# Patient Record
Sex: Male | Born: 1965 | Race: Black or African American | Hispanic: No | Marital: Married | Smoking: Never smoker
Health system: Southern US, Community
[De-identification: ages and names within clinical notes are randomized; demographics above are authoritative.]

## PROBLEM LIST (undated history)

## (undated) DIAGNOSIS — I471 Supraventricular tachycardia, unspecified: Secondary | ICD-10-CM

## (undated) DIAGNOSIS — T7840XA Allergy, unspecified, initial encounter: Secondary | ICD-10-CM

## (undated) HISTORY — DX: Allergy, unspecified, initial encounter: T78.40XA

---

## 2003-06-26 ENCOUNTER — Emergency Department (HOSPITAL_COMMUNITY): Admission: AD | Admit: 2003-06-26 | Discharge: 2003-06-26 | Payer: Self-pay | Admitting: Family Medicine

## 2007-05-13 ENCOUNTER — Emergency Department (HOSPITAL_COMMUNITY): Admission: EM | Admit: 2007-05-13 | Discharge: 2007-05-13 | Payer: Self-pay | Admitting: Emergency Medicine

## 2007-05-24 ENCOUNTER — Ambulatory Visit: Payer: Self-pay | Admitting: Internal Medicine

## 2008-05-16 ENCOUNTER — Encounter: Admission: RE | Admit: 2008-05-16 | Discharge: 2008-05-16 | Payer: Self-pay | Admitting: Chiropractic Medicine

## 2010-09-24 NOTE — Assessment & Plan Note (Signed)
Bruce Guerra                         ELECTROPHYSIOLOGY OFFICE NOTE   Bruce Guerra, VONDERHAAR                       MRN:          [id number]  DATE:05/24/2007                            DOB:          1965-12-20    REASON FOR VISIT:  Bruce Guerra is seen at the request of the emergency  room following a presentation with rapid supraventricular tachycardia  terminated, as best as I can tell, by adenosine.   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  This episode occurred abruptly after playing Wii tennis.  It was  accompanied by presyncope, diaphoresis, some chest pain and shortness of  breath.  It lasted about 15 minutes which included the call to EMS,  their arrival, an IV placed and probably adenosine being pushed.  He has  had no recurrent episodes.   He has no history of chest pain, shortness of breath or exercise  intolerance apart from the above-mentioned episode.  He has been noted  to be somewhat hypertensive.  His cardiac risk factors are not known  apart from hypertension and obesity.  There has been no cardiac  evaluation in the past.   PAST SURGICAL HISTORY:  Negative.   PAST MEDICAL HISTORY:  Allergies and hay fever, but otherwise is broadly  negative.   MEDICATIONS:  Metoprolol succinate started in the emergency room.   ALLERGIES:  No known drug allergies.   SOCIAL HISTORY:  He is married.  He has three children including a  daughter 26 and twin boys that are 54.  He does not use cigarettes,  alcohol or recreational drugs.   PHYSICAL EXAMINATION:  VITAL SIGNS:  Blood pressure was 146/90 with a  pulse of 67.  His weight was 242 pounds, height 5 feet 11 inches.  HEENT:  No icterus or xanthoma.  NECK:  The neck veins are flat.  The carotids are brisk and full  bilaterally without bruits.  BACK:  Without kyphosis or scoliosis.  LUNGS:  Clear.  HEART:  Heart sounds were regular without murmurs or gallops.  ABDOMEN:  Soft with active bowel sounds and without midline pulsation or  organomegaly.  Femoral pulses were 2+, distal pulses were intact.  EXTREMITIES:  There is no clubbing, cyanosis or edema.  NEUROLOGIC:  Grossly normal.  SKIN:  Warm and dry.   Electrocardiogram from Bruce Guerra demonstrated sinus rhythm at  99 with intervals of 0.15/0.19/0.31.  The axis was 60 degrees.  There  was a rapid transition in RR prime in lead V1.  There is no evidence of  ventricular pre-excitation.   Other laboratories were normal except for myoglobin of 462.   IMPRESSION/PLAN:  1. Recurrent supraventricular tachycardia.  2. Moderate symptoms of lightheadedness associated with #1.  3. Obesity.  4. Hypertension.   Bruce Guerra has supraventricular tachycardia probably with concealed  accessory pathway.  It has been relatively infrequent.  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  I have reviewed this with him and we have  discussed treatment options including observation, beta-blocker/calcium  blockers on as needed or daily basis similar to what strategy has been  pursued thus far, drugs with potential for poor arrhythmia and EP study  with RF catheter ablation.  We discussed the potential benefits as well  as the potential risks including, but not limited to death, perforation  and heart attack as well as heart block requiring pacemaker  implantation.  He understands these risks.  At this point, however,  given his financial situation, he would like not to pursue invasive  therapy.   We have decided to give him a prescription for verapamil 40 mg to take  on an as-needed basis as we do not know what the frequency of these  events will be.  Will plan to see him again in 6  months' time.   He is advised to call if he has any more problems.     Bruce Salvia, MD, Bruce Guerra  Electronically Signed    SCK/MedQ  DD: 05/24/2007  DT: 05/25/2007  Job #: [id number]

## 2011-01-30 LAB — COMPREHENSIVE METABOLIC PANEL
ALT: 41
AST: 36
Alkaline Phosphatase: 78
CO2: 28
Chloride: 103
GFR calc non Af Amer: >60
Glucose, Bld: 94
Potassium: 4.2
Sodium: 139

## 2011-01-30 LAB — DIFFERENTIAL
Basophils Absolute: 0
Lymphs Abs: 1.3
Monocytes Absolute: 0.3
Monocytes Relative: 4
Neutro Abs: 5.3

## 2011-01-30 LAB — POCT CARDIAC MARKERS
CKMB, poc: 2.7
Troponin i, poc: <0.05

## 2011-01-30 LAB — CBC
Platelets: 278
RBC: 4.69
WBC: 7.1

## 2012-08-11 ENCOUNTER — Ambulatory Visit: Payer: Self-pay | Admitting: Family Medicine

## 2012-08-11 VITALS — BP 132/95 | HR 78 | Temp 98.0°F | Resp 17 | Ht 70.5 in | Wt 234.0 lb

## 2012-08-11 DIAGNOSIS — R809 Proteinuria, unspecified: Secondary | ICD-10-CM | POA: Insufficient documentation

## 2012-08-11 DIAGNOSIS — Z0289 Encounter for other administrative examinations: Secondary | ICD-10-CM

## 2012-08-11 DIAGNOSIS — Z0000 Encounter for general adult medical examination without abnormal findings: Secondary | ICD-10-CM

## 2012-08-11 NOTE — Patient Instructions (Addendum)
Your blood pressure is mildly elevated today.  You qualify for a one year DOT card, but need to follow- up with the doctor of your choice to further evaluate this issue. Also, you had protein and blood in your urine.  This also needs to be checked by the doctor of your choice.

## 2012-08-11 NOTE — Progress Notes (Signed)
[facility] [address] [place] [address] [phone]  Date:  08/11/2012   Name:  Bruce Guerra   DOB:  05/08/1966   MRN:  [id number]  PCP:  No primary provider on file.    Chief Complaint: Employment Physical   History of Present Illness:  Bruce Guerra is a 47 y.o. very pleasant male patient who presents with the following:  Here today for a DOT exam.  He drives a semi- local route. No history of trouble with his BP.    He does not have a PCP or health insurance at this time, although he is in the process of applying for health coverage He does take methotrexate for dermatitis. Otherwise he is not aware of any significant medical problems  See long form  There is no problem list on file for this patient.   Past Medical History  Diagnosis Date  . Allergy     History reviewed. No pertinent past surgical history.  History  Substance Use Topics  . Smoking status: Never Smoker   . Smokeless tobacco: Not on file  . Alcohol Use: No    Family History  Problem Relation Age of Onset  . Diabetes Mother   . Hypertension Father     Allergies not on file  Medication list has been reviewed and updated.  No current outpatient prescriptions on file prior to visit.   No current facility-administered medications on file prior to visit.    Review of Systems:  As per HPI- otherwise negative.'  Physical Examination: Filed Vitals:   08/11/12 1031  BP: 158/96  Pulse: 78  Temp: 98 F (36.7 C)  Resp: 17   Filed Vitals:   08/11/12 1031  Height: 5' 10.5" (1.791 m)  Weight: 234 lb (106.142 kg)   Body mass index is 33.09 kg/(m^2). Ideal Body Weight: Weight in (lb) to have BMI = 25: 176.4  GEN: WDWN, NAD, Non-toxic, A & O x 3, overweight HEENT: Atraumatic, Normocephalic. Neck supple. No masses, No LAD.  Bilateral TM wnl, oropharynx normal.  PEERL,EOMI.   Ears and Nose: No external deformity. CV: RRR, No M/G/R. No JVD. No thrill. No extra  heart sounds. PULM: CTA B, no wheezes, crackles, rhonchi. No retractions. No resp. distress. No accessory muscle use. ABD: S, NT, ND, +BS. No rebound. No HSM. EXTR: No c/c/e NEURO Normal gait.  Normal strength and DTR all extremities  PSYCH: Normally interactive. Conversant. Not depressed or anxious appearing.  Calm demeanor.  GU: no inguinal hernia noted.  Mild rash on extremities which appears consistent with an eczema type rash Assessment and Plan: Physical exam, annual  DOT exam.  He has proteinuria/ hematuria and elevated BP.  Will give a one year card today.  Encouraged him to follow- up the proteinuria/ hematuria and BP with the doctor of his choice.   Signed Abbe Amsterdam, MD

## 2012-08-28 ENCOUNTER — Encounter: Payer: Self-pay | Admitting: Family Medicine

## 2013-09-05 ENCOUNTER — Ambulatory Visit: Payer: Self-pay

## 2015-03-09 DIAGNOSIS — M79673 Pain in unspecified foot: Secondary | ICD-10-CM

## 2015-03-15 ENCOUNTER — Emergency Department (HOSPITAL_COMMUNITY)
Admission: EM | Admit: 2015-03-15 | Discharge: 2015-03-15 | Disposition: A | Discharge status: Home / Self Care | Payer: Self-pay | Attending: Emergency Medicine | Admitting: Emergency Medicine

## 2015-03-15 ENCOUNTER — Emergency Department (HOSPITAL_COMMUNITY): Payer: Self-pay

## 2015-03-15 ENCOUNTER — Encounter (HOSPITAL_COMMUNITY): Payer: Self-pay | Admitting: General Practice

## 2015-03-15 DIAGNOSIS — Z79899 Other long term (current) drug therapy: Secondary | ICD-10-CM | POA: Insufficient documentation

## 2015-03-15 DIAGNOSIS — I471 Supraventricular tachycardia: Secondary | ICD-10-CM | POA: Insufficient documentation

## 2015-03-15 HISTORY — DX: Supraventricular tachycardia, unspecified: I47.10

## 2015-03-15 HISTORY — DX: Supraventricular tachycardia: I47.1

## 2015-03-15 LAB — CBC WITH DIFFERENTIAL/PLATELET
BASOS ABS: 0 10*3/uL (ref 0.0–0.1)
Basophils Relative: 0 %
Eosinophils Absolute: 0.1 10*3/uL (ref 0.0–0.7)
Eosinophils Relative: 1 %
HEMATOCRIT: 40.7 % (ref 39.0–52.0)
HEMOGLOBIN: 13.5 g/dL (ref 13.0–17.0)
LYMPHS PCT: 14 %
Lymphs Abs: 1 10*3/uL (ref 0.7–4.0)
MCH: 30.3 pg (ref 26.0–34.0)
MCHC: 33.2 g/dL (ref 30.0–36.0)
MCV: 91.3 fL (ref 78.0–100.0)
MONO ABS: 1.1 10*3/uL — AB (ref 0.1–1.0)
MONOS PCT: 15 %
NEUTROS ABS: 5.2 10*3/uL (ref 1.7–7.7)
NEUTROS PCT: 70 %
Platelets: 257 10*3/uL (ref 150–400)
RBC: 4.46 MIL/uL (ref 4.22–5.81)
RDW: 12.6 % (ref 11.5–15.5)
WBC: 7.5 10*3/uL (ref 4.0–10.5)

## 2015-03-15 LAB — BASIC METABOLIC PANEL
ANION GAP: 13 (ref 5–15)
BUN: 14 mg/dL (ref 6–20)
CO2: 25 mmol/L (ref 22–32)
Calcium: 9.8 mg/dL (ref 8.9–10.3)
Chloride: 102 mmol/L (ref 101–111)
Creatinine, Ser: 1.47 mg/dL — ABNORMAL HIGH (ref 0.61–1.24)
GFR calc Af Amer: >60 mL/min (ref >60)
GFR calc non Af Amer: 54 mL/min — ABNORMAL LOW (ref >60)
GLUCOSE: 76 mg/dL (ref 65–99)
POTASSIUM: 4.4 mmol/L (ref 3.5–5.1)
Sodium: 140 mmol/L (ref 135–145)

## 2015-03-15 LAB — TROPONIN I: TROPONIN I: 0.03 ng/mL (ref <0.031)

## 2015-03-15 MED ORDER — METOPROLOL SUCCINATE ER 25 MG PO TB24: 25.0000 mg | ORAL_TABLET | Freq: Every day | ORAL | Status: DC

## 2015-03-15 NOTE — Discharge Instructions (Signed)
[phone]].   They will call tomorrow for an appt.  New medication for your heart rate.  Your kidney test was slightly elevated.

## 2015-03-15 NOTE — ED Notes (Signed)
Pt presents via [facility] with complaints of heart palpitations. 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 When EMS arrived pts heart rate was 204. EMS administered 6mg , 12mg  and another 12 mg of adenosine before pt converted back to a NSR. Pt is currently asymptomatic, and HR is at 95. Pt is A/O. Pt reports 4 episodes of SVT, in his life. Pt denies CP, or N/V.

## 2015-03-16 NOTE — ED Provider Notes (Signed)
CSN: [id number]     Arrival date & time 03/15/15  1750 History   First MD Initiated Contact with Patient 03/15/15 1752     Chief Complaint  Patient presents with  . Tachycardia     (Consider location/radiation/quality/duration/timing/severity/associated sxs/prior Treatment) HPI.... Level V caveat for urgent need for intervention. Patient was doing heavy lifting when he felt a sense of a rapid heartbeat. EMS was notified. He appeared to be in a supraventricular tachycardia rate 200. Adenosine 6, 12, 12 mg administered which resulted in a conversion to normal sinus rhythm. No chest pain. Minimal dyspnea and diaphoresis initially. Patient reports similar symptoms 4-5 times the past.  Past Medical History  Diagnosis Date  . Allergy   . SVT (supraventricular tachycardia) (HCC)    History reviewed. No pertinent past surgical history. Family History  Problem Relation Age of Onset  . Diabetes Mother   . Hypertension Father    Social History  Substance Use Topics  . Smoking status: Never Smoker   . Smokeless tobacco: None  . Alcohol Use: No    Review of Systems  Reason unable to perform ROS: Urgent need for intervention.      Allergies  Review of patient's allergies indicates no known allergies.  Home Medications   Prior to Admission medications   Medication Sig Start Date End Date Taking? Authorizing Provider  cetirizine (ZYRTEC) 10 MG tablet Take 10 mg by mouth daily.   Yes Historical Provider, MD  fexofenadine (ALLEGRA) 180 MG tablet Take 180 mg by mouth daily.   Yes Historical Provider, MD  methotrexate (RHEUMATREX) 10 MG tablet Take 10 mg by mouth once a week. Caution: Chemotherapy. Protect from light.    Historical Provider, MD  metoprolol succinate (TOPROL-XL) 25 MG 24 hr tablet Take 1 tablet (25 mg total) by mouth daily. 03/15/15   Donnetta HutchingBrian Calloway Andrus, MD   BP 130/87 mmHg  Pulse 73  Temp(Src) 98.9 F (37.2 C) (Oral)  Resp 15  Ht 5\' 11"  (1.803 m)  Wt 235 lb (106.595 kg)   BMI 32.79 kg/m2  SpO2 95% Physical Exam  Constitutional: He is oriented to person, place, and time. He appears well-developed and well-nourished.  HENT:  Head: Normocephalic and atraumatic.  Eyes: Conjunctivae and EOM are normal. Pupils are equal, round, and reactive to light.  Neck: Normal range of motion. Neck supple.  Cardiovascular: Normal rate and regular rhythm.   Pulmonary/Chest: Effort normal and breath sounds normal.  Abdominal: Soft. Bowel sounds are normal.  Musculoskeletal: Normal range of motion.  Neurological: He is alert and oriented to person, place, and time.  Skin: Skin is warm and dry.  Psychiatric: He has a normal mood and affect. His behavior is normal.  Nursing note and vitals reviewed.   ED Course  Procedures (including critical care time) Labs Review Labs Reviewed  BASIC METABOLIC PANEL - Abnormal; Notable for the following:    Creatinine, Ser 1.47 (*)    GFR calc non Af Amer 54 (*)    All other components within normal limits  CBC WITH DIFFERENTIAL/PLATELET - Abnormal; Notable for the following:    Monocytes Absolute 1.1 (*)    All other components within normal limits  TROPONIN I    Imaging Review Dg Chest 2 View  03/15/2015  CLINICAL DATA:  49 year old with acute onset of palpitations earlier today with heart rate up to 204, converting after 3 rounds of Adenosine. Patient has had prior episodes of SVT. EXAM: CHEST  2 VIEW COMPARISON:  05/13/2007. FINDINGS:  Cardiac silhouette upper normal in size, unchanged. Hilar and mediastinal contours otherwise unremarkable. Lungs clear. Bronchovascular markings normal. Pulmonary vascularity normal. No visible pleural effusions. No pneumothorax. Visualized bony thorax intact. IMPRESSION: Stable borderline heart size.  No acute cardiopulmonary disease. Electronically Signed   By: Hulan Saas M.D.   On: 03/15/2015 18:35   I have personally reviewed and evaluated these images and lab results as part of my medical  decision-making.   EKG Interpretation   Date/Time:  Thursday March 15 2015 17:58:55 EDT Ventricular Rate:  97 PR Interval:  159 QRS Duration: 93 QT Interval:  362 QTC Calculation: 460 R Axis:   60 Text Interpretation:  Sinus rhythm RSR' in V1 or V2, right VCD or RVH  Nonspecific T abnormalities, anterior leads Confirmed by Tamryn Popko  MD, Vendetta Pittinger  [phone]) on 03/15/2015 6:31:54 PM      MDM   Final diagnoses:  SVT (supraventricular tachycardia) (HCC)    Discussed clinical scenario with cardiologist on-call. He recommended Toprol extended release 25 mg daily. Follow-up with [facility] cardiology. These findings were discussed with the patient and his family. I also discussed his minimally elevated creatinine.    Donnetta Hutching, MD 03/16/15 [phone]

## 2015-03-28 NOTE — Progress Notes (Signed)
Patient ID: Bruce Guerra, male   DOB: 1965/08/11, 49 y.o.   MRN: [id number]     Cardiology Office Note   Date:  03/28/2015   ID:  Bruce Guerra, DOB 1965-09-26, MRN [id number]  PCP:  No primary care provider on file.  Cardiologist:   Charlton Haws, MD   No chief complaint on file.     History of Present Illness: Bruce Guerra is a 49 y.o. male who presents for evaluation of PSVT.  Seen in ER 03/16/15  HR reported 200 No ECG documentation Patient indicated has happened 4-5 times before.  Given adenosine 6,12,12 with conversion to NSR  NO accessory pathway seen on baseline ECG  Labs ok except Cr 1.4  No TSH done Troponin negatie     Past Medical History  Diagnosis Date  . Allergy   . SVT (supraventricular tachycardia) (HCC)     No past surgical history on file.   Current Outpatient Prescriptions  Medication Sig Dispense Refill  . cetirizine (ZYRTEC) 10 MG tablet Take 10 mg by mouth daily.    . fexofenadine (ALLEGRA) 180 MG tablet Take 180 mg by mouth daily.    . methotrexate (RHEUMATREX) 10 MG tablet Take 10 mg by mouth once a week. Caution: Chemotherapy. Protect from light.    . metoprolol succinate (TOPROL-XL) 25 MG 24 hr tablet Take 1 tablet (25 mg total) by mouth daily. 30 tablet 1   No current facility-administered medications for this visit.    Allergies:   Review of patient's allergies indicates no known allergies.    Social History:  The patient  reports that he has never smoked. He does not have any smokeless tobacco history on file. He reports that he does not drink alcohol or use illicit drugs.   Family History:  The patient's family history includes Diabetes in his mother; Hypertension in his father.    ROS:  Please see the history of present illness.   Otherwise, review of systems are positive for none.   All other systems are reviewed and negative.    PHYSICAL EXAM: VS:  There were no vitals taken for this visit. , BMI There is no weight on file to  calculate BMI. Affect appropriate Healthy:  appears stated age HEENT: normal Neck supple with no adenopathy JVP normal no bruits no thyromegaly Lungs clear with no wheezing and good diaphragmatic motion Heart:  S1/S2 no murmur, no rub, gallop or click PMI normal Abdomen: benighn, BS positve, no tenderness, no AAA no bruit.  No HSM or HJR Distal pulses intact with no bruits No edema Neuro non-focal Skin warm and dry No muscular weakness    EKG:  Post conversion NSR no pre excitation nonspecific ST changes    Recent Labs: 03/15/2015: BUN 14; Creatinine, Ser 1.47*; Hemoglobin 13.5; Platelets 257; Potassium 4.4; Sodium 140    Lipid Panel No results found for: CHOL, TRIG, HDL, CHOLHDL, VLDL, LDLCALC, LDLDIRECT    Wt Readings from Last 3 Encounters:  03/15/15 106.595 kg (235 lb)  08/11/12 106.142 kg (234 lb)      Other studies Reviewed: Additional studies/ records that were reviewed today include: ER notes and ECG;s LABS.    ASSESSMENT AND PLAN:  1.  PSVT:  Refer to EP for consideration of ablation continue low dose beta blocker  Echo to r/o structural heart disease   Current medicines are reviewed at length with the patient today.  The patient does not have concerns regarding medicines.  The following changes  have been made:  no change  Labs/ tests ordered today include: Echo  Refer to EP  No orders of the defined types were placed in this encounter.     Disposition:   FU with EP next available      Signed, Charlton HawsPeter Renelle Stegenga, MD  03/28/2015 3:16 PM    [facility] [address], [place], [place]  [address] Phone: [phone]; Fax: [phone]

## 2015-03-29 ENCOUNTER — Encounter: Payer: Self-pay | Admitting: Cardiovascular Disease

## 2015-03-29 DIAGNOSIS — R0989 Other specified symptoms and signs involving the circulatory and respiratory systems: Secondary | ICD-10-CM

## 2015-03-30 ENCOUNTER — Encounter: Payer: Self-pay | Admitting: Cardiovascular Disease

## 2015-09-24 ENCOUNTER — Ambulatory Visit (INDEPENDENT_AMBULATORY_CARE_PROVIDER_SITE_OTHER): Payer: Self-pay | Admitting: Family Medicine

## 2015-09-24 VITALS — BP 130/88 | HR 80 | Temp 97.9°F | Resp 16 | Ht 71.0 in | Wt 236.0 lb

## 2015-09-24 DIAGNOSIS — Z024 Encounter for examination for driving license: Secondary | ICD-10-CM

## 2015-09-24 DIAGNOSIS — Z021 Encounter for pre-employment examination: Secondary | ICD-10-CM

## 2015-09-24 NOTE — Patient Instructions (Addendum)
It is important that you see the cardiologist to evaluate you for your supraventricular tachycardia history. Please do so in the near future.  Your blood pressure was transiently elevated. The cardiologist can recheck it also, but I would recommend that you get it checked every couple of months to make sure that it is not staying elevated often.  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    IF you received an x-ray today, you will receive an invoice from [facility]. Please contact [facility] at [phone] with questions or concerns regarding your invoice.   IF you received labwork today, you will receive an invoice from [facility]. Please contact [facility] at [phone] with questions or concerns regarding your invoice.   Our billing staff will not be able to assist you with questions regarding bills from these companies.  You will be contacted with the lab results as soon as they are available. The fastest way to get your results is to activate your My Chart account. Instructions are located on the last page of this paperwork. If you have not heard from us regarding the results in 2 weeks, please contact this office.

## 2015-09-24 NOTE — Progress Notes (Signed)
Patient ID: Bruce Guerra, male    DOB: 1965/12/26  Age: 50 y.o. MRN: [id number]  Chief Complaint  Patient presents with  . 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 Here for his DOT exam. He feels well with no problems reported. In talking with him and reviewing the old records I see that he had to go to the emergency room last year for an episode of paroxysmal SVT which responded to adenosine. Apparently he has had 4 such episodes since he was 50 years old. He was told to see a cardiologist, but insurance lapsed and he is not yet. He plans to see one soon. The symptoms come on with just a palpitation sensation in his chest, with ample time to pull off the road. That is when he did last time, and called 911. He is not on any medication at this time.  Current allergies, medications, problem list, past/family and social histories reviewed.  Objective:  BP 130/88 mmHg  Pulse 80  Temp(Src) 97.9 F (36.6 C) (Oral)  Resp 16  Ht 5\' 11"  (1.803 m)  Wt 236 lb (107.049 kg)  BMI 32.93 kg/m2  SpO2 99%  Healthy-appearing man in no acute distress. TMs well. Throat clear. Neck supple without nodes. No carotid bruits. Fundi benign. Chest clear. Heart regular without murmur. Himself without mass or tenderness. Normal male external genitalia with testes descended. No hernias. Extremities unremarkable. Has a chronic appearing dermatosis scattered on his trunk and legs, may be a tinea.  Assessment & Plan:   Assessment: 1. Driver's permit physical examination       Plan: Normal DOT examination but history of paroxysmal SVT rarely. Although I did not restrict his card I did tell him it is important he sees a cardiologist. Also advised him to keep an eye on his blood pressure. It was fine when I took it.  No orders of the defined types were placed in this encounter.    No orders of the defined types were placed in this encounter.          Patient Instructions   It is important that you see the cardiologist to evaluate you for your supraventricular tachycardia history. Please do so in the near future.  Your blood pressure was transiently elevated. The cardiologist can recheck it also, but I would recommend that you get it checked every couple of months to make sure that it is not staying elevated often.  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    IF you received an x-ray today, you will receive an invoice from [facility]. Please contact [facility] at [phone] with questions or concerns regarding your invoice.   IF you received labwork today, you will receive an invoice from [facility]. Please contact [facility] at [phone] with questions or concerns regarding your invoice.   Our billing staff will not be able to assist you with questions regarding bills from these companies.  You will be contacted with the lab results as soon as they are available. The fastest way to get your results is to activate your My Chart account. Instructions are located on the last page of this paperwork. If you have not heard from us regarding the results in 2 weeks, please contact this office.           No Follow-up on file.   HOPPER,DAVID, MD 09/24/2015

## 2015-09-25 ENCOUNTER — Emergency Department (HOSPITAL_BASED_OUTPATIENT_CLINIC_OR_DEPARTMENT_OTHER)
Admission: EM | Admit: 2015-09-25 | Discharge: 2015-09-25 | Disposition: A | Discharge status: Home / Self Care | Payer: BLUE CROSS/BLUE SHIELD | Attending: Emergency Medicine | Admitting: Emergency Medicine

## 2015-09-25 ENCOUNTER — Encounter (HOSPITAL_BASED_OUTPATIENT_CLINIC_OR_DEPARTMENT_OTHER): Payer: Self-pay

## 2015-09-25 ENCOUNTER — Emergency Department (HOSPITAL_BASED_OUTPATIENT_CLINIC_OR_DEPARTMENT_OTHER): Payer: BLUE CROSS/BLUE SHIELD

## 2015-09-25 DIAGNOSIS — IMO0002 Reserved for concepts with insufficient information to code with codable children: Secondary | ICD-10-CM

## 2015-09-25 DIAGNOSIS — R42 Dizziness and giddiness: Secondary | ICD-10-CM | POA: Insufficient documentation

## 2015-09-25 LAB — CBC WITH DIFFERENTIAL/PLATELET
BASOS ABS: 0 10*3/uL (ref 0.0–0.1)
Basophils Relative: 1 %
Eosinophils Absolute: 0.2 10*3/uL (ref 0.0–0.7)
Eosinophils Relative: 2 %
HEMATOCRIT: 41.5 % (ref 39.0–52.0)
HEMOGLOBIN: 14.1 g/dL (ref 13.0–17.0)
LYMPHS ABS: 1.4 10*3/uL (ref 0.7–4.0)
LYMPHS PCT: 22 %
MCH: 30.6 pg (ref 26.0–34.0)
MCHC: 34 g/dL (ref 30.0–36.0)
MCV: 90 fL (ref 78.0–100.0)
Monocytes Absolute: 0.9 10*3/uL (ref 0.1–1.0)
Monocytes Relative: 14 %
NEUTROS ABS: 4 10*3/uL (ref 1.7–7.7)
NEUTROS PCT: 61 %
PLATELETS: 235 10*3/uL (ref 150–400)
RBC: 4.61 MIL/uL (ref 4.22–5.81)
RDW: 12.3 % (ref 11.5–15.5)
WBC: 6.4 10*3/uL (ref 4.0–10.5)

## 2015-09-25 LAB — BASIC METABOLIC PANEL
ANION GAP: 6 (ref 5–15)
BUN: 15 mg/dL (ref 6–20)
CHLORIDE: 102 mmol/L (ref 101–111)
CO2: 29 mmol/L (ref 22–32)
Calcium: 9.5 mg/dL (ref 8.9–10.3)
Creatinine, Ser: 1.03 mg/dL (ref 0.61–1.24)
GFR calc Af Amer: >60 mL/min (ref >60)
GLUCOSE: 100 mg/dL — AB (ref 65–99)
POTASSIUM: 4.1 mmol/L (ref 3.5–5.1)
Sodium: 137 mmol/L (ref 135–145)

## 2015-09-25 LAB — TROPONIN I: Troponin I: <0.03 ng/mL (ref <0.031)

## 2015-09-25 MED ORDER — MECLIZINE HCL 12.5 MG PO TABS: 12.5000 mg | ORAL_TABLET | Freq: Three times a day (TID) | ORAL | Status: DC | PRN

## 2015-09-25 MED ORDER — GI COCKTAIL ~~LOC~~
30.0000 mL | Freq: Once | ORAL | Status: AC
  Administered 2015-09-25: 30 mL via ORAL
  Filled 2015-09-25: qty 30

## 2015-09-25 MED ORDER — FLUTICASONE PROPIONATE 50 MCG/ACT NA SUSP: 2.0000 | Freq: Every day | NASAL | Status: DC

## 2015-09-25 NOTE — ED Provider Notes (Signed)
CSN: [id number]     Arrival date & time 09/25/15  0335 History   First MD Initiated Contact with Patient 09/25/15 [phone]     Chief Complaint  Patient presents with  . Dizziness     (Consider location/radiation/quality/duration/timing/severity/associated sxs/prior Treatment) Patient is a 50 y.o. male presenting with dizziness. The history is provided by the patient.  Dizziness Quality:  Room spinning Severity:  Severe Onset quality:  Sudden Timing:  Constant Progression:  Resolved Chronicity:  Recurrent Context: ear pain   Context: not with bowel movement and not with loss of consciousness   Relieved by:  Nothing Worsened by:  Nothing Ineffective treatments:  None tried Associated symptoms: no blood in stool, no chest pain, no headaches, no hearing loss, no palpitations, no shortness of breath, no syncope, no tinnitus, no vision changes and no weakness   Risk factors: no Meniere's disease   Worse laying with that ear down.  No focal neuro deficits.    Past Medical History  Diagnosis Date  . Allergy   . SVT (supraventricular tachycardia) (HCC)    History reviewed. No pertinent past surgical history. Family History  Problem Relation Age of Onset  . Diabetes Mother   . Hypertension Father    Social History  Substance Use Topics  . Smoking status: Never Smoker   . Smokeless tobacco: None  . Alcohol Use: No    Review of Systems  HENT: Negative for hearing loss and tinnitus.   Respiratory: Negative for shortness of breath.   Cardiovascular: Negative for chest pain, palpitations and syncope.  Gastrointestinal: Negative for blood in stool.  Neurological: Positive for dizziness. Negative for weakness and headaches.  All other systems reviewed and are negative.     Allergies  Review of patient's allergies indicates no known allergies.  Home Medications   Prior to Admission medications   Medication Sig Start Date End Date Taking? Authorizing Provider  cetirizine  (ZYRTEC) 10 MG tablet Take 10 mg by mouth daily.    Historical Provider, MD  fexofenadine (ALLEGRA) 180 MG tablet Take 180 mg by mouth daily.    Historical Provider, MD  methotrexate (RHEUMATREX) 10 MG tablet Take 10 mg by mouth once a week. Reported on 09/24/2015    Historical Provider, MD  metoprolol succinate (TOPROL-XL) 25 MG 24 hr tablet Take 1 tablet (25 mg total) by mouth daily. Patient not taking: Reported on 09/24/2015 03/15/15   Donnetta Hutching, MD   BP 141/94 mmHg  Pulse 66  Temp(Src) 97.8 F (36.6 C) (Oral)  Resp 18  Ht 5\' 11"  (1.803 m)  Wt 235 lb (106.595 kg)  BMI 32.79 kg/m2  SpO2 99% Physical Exam  Constitutional: He is oriented to person, place, and time. He appears well-developed and well-nourished. No distress.  HENT:  Head: Normocephalic and atraumatic.  Right Ear: No mastoid tenderness. Tympanic membrane is retracted. Tympanic membrane is not injected, not perforated and not erythematous. No hemotympanum.  Left Ear: No mastoid tenderness. Tympanic membrane is not injected, not perforated, not erythematous and not retracted. No hemotympanum.  Mouth/Throat: Oropharynx is clear and moist.  Right TM is retracted  Eyes: Conjunctivae and EOM are normal. Pupils are equal, round, and reactive to light.  Neck: Normal range of motion. Neck supple.  Cardiovascular: Normal rate, regular rhythm and intact distal pulses.   Pulmonary/Chest: Effort normal and breath sounds normal. No stridor. He has no wheezes. He has no rales.  Abdominal: Soft. Bowel sounds are normal. There is no tenderness. There is no  rebound and no guarding.  Musculoskeletal: Normal range of motion.  Neurological: He is alert and oriented to person, place, and time. He has normal reflexes. No cranial nerve deficit.  Skin: Skin is warm and dry.  Psychiatric: He has a normal mood and affect.    ED Course  Procedures (including critical care time) Labs Review Labs Reviewed  CBC WITH DIFFERENTIAL/PLATELET  BASIC  METABOLIC PANEL  TROPONIN I    Imaging Review No results found. I have personally reviewed and evaluated these images and lab results as part of my medical decision-making.   EKG Interpretation   Date/Time:  Tuesday Sep 25 2015 04:00:56 EDT Ventricular Rate:  65 PR Interval:  168 QRS Duration: 107 QT Interval:  411 QTC Calculation: 427 R Axis:   73 Text Interpretation:  Sinus rhythm RSR' in V1 or V2,   Confirmed by  [facility]  MD, Jazzlyn Huizenga ([id number]) on 09/25/2015 4:08:09 AM      MDM   Final diagnoses:  None    Filed Vitals:   09/25/15 0346  BP: 141/94  Pulse: 66  Temp: 97.8 F (36.6 C)  Resp: 18    Results for orders placed or performed during the hospital encounter of 09/25/15  CBC with Differential/Platelet  Result Value Ref Range   WBC 6.4 4.0 - 10.5 K/uL   RBC 4.61 4.22 - 5.81 MIL/uL   Hemoglobin 14.1 13.0 - 17.0 g/dL   HCT 91.441.5 78.239.0 - 95.652.0 %   MCV 90.0 78.0 - 100.0 fL   MCH 30.6 26.0 - 34.0 pg   MCHC 34.0 30.0 - 36.0 g/dL   RDW 21.312.3 08.611.5 - 57.815.5 %   Platelets 235 150 - 400 K/uL   Neutrophils Relative % 61 %   Neutro Abs 4.0 1.7 - 7.7 K/uL   Lymphocytes Relative 22 %   Lymphs Abs 1.4 0.7 - 4.0 K/uL   Monocytes Relative 14 %   Monocytes Absolute 0.9 0.1 - 1.0 K/uL   Eosinophils Relative 2 %   Eosinophils Absolute 0.2 0.0 - 0.7 K/uL   Basophils Relative 1 %   Basophils Absolute 0.0 0.0 - 0.1 K/uL  Basic metabolic panel  Result Value Ref Range   Sodium 137 135 - 145 mmol/L   Potassium 4.1 3.5 - 5.1 mmol/L   Chloride 102 101 - 111 mmol/L   CO2 29 22 - 32 mmol/L   Glucose, Bld 100 (H) 65 - 99 mg/dL   BUN 15 6 - 20 mg/dL   Creatinine, Ser 4.691.03 0.61 - 1.24 mg/dL   Calcium 9.5 8.9 - 62.910.3 mg/dL   GFR calc non Af Amer >60 >60 mL/min   GFR calc Af Amer >60 >60 mL/min   Anion gap 6 5 - 15  Troponin I  Result Value Ref Range   Troponin I <0.03 <0.031 ng/mL   Dg Chest 2 View  09/25/2015  CLINICAL DATA:  Pt c/o dizziness after eating vanilla pudding  last pm, started around 2130 but has got better since he came here EXAM: CHEST  2 VIEW COMPARISON:  03/15/2015 FINDINGS: The heart size and mediastinal contours are within normal limits. Both lungs are clear. The visualized skeletal structures are unremarkable. IMPRESSION: No active cardiopulmonary disease. Electronically Signed   By: Burman NievesWilliam  Stevens M.D.   On: 09/25/2015 05:00   Ct Head Wo Contrast  09/25/2015  CLINICAL DATA:  Dizziness after eating vanilla pudding last night, now improving. EXAM: CT HEAD WITHOUT CONTRAST TECHNIQUE: Contiguous axial images were obtained from the  base of the skull through the vertex without intravenous contrast. COMPARISON:  None. FINDINGS: INTRACRANIAL CONTENTS: The ventricles and sulci are normal. No intraparenchymal hemorrhage, mass effect nor midline shift. No acute large vascular territory infarcts. No abnormal extra-axial fluid collections. Basal cisterns are patent. ORBITS: The included ocular globes and orbital contents are normal. SINUSES: The mastoid aircells and included paranasal sinuses are well-aerated. SKULL/SOFT TISSUES: No skull fracture. No significant soft tissue swelling. IMPRESSION: Normal CT HEAD. Electronically Signed   By: Awilda Metro M.D.   On: 09/25/2015 04:58    Symptoms consistent with BPV.  Will prescribe meclizine as well as flonase to decongest.  Follow up with your family doctor for recheck.  Strict return precautions given   Othelia Riederer, MD 09/25/15 [phone]

## 2015-09-25 NOTE — Discharge Instructions (Signed)

## 2015-09-25 NOTE — ED Notes (Signed)
Pt c/o dizziness after eating vanilla pudding last pm, started around 2130 but has got better since he came here;

## 2015-10-23 ENCOUNTER — Ambulatory Visit (INDEPENDENT_AMBULATORY_CARE_PROVIDER_SITE_OTHER): Payer: BLUE CROSS/BLUE SHIELD | Admitting: Cardiovascular Disease

## 2015-10-23 ENCOUNTER — Encounter: Payer: Self-pay | Admitting: Cardiovascular Disease

## 2015-10-23 VITALS — BP 134/82 | HR 73 | Ht 71.0 in | Wt 236.0 lb

## 2015-10-23 DIAGNOSIS — R0600 Dyspnea, unspecified: Secondary | ICD-10-CM

## 2015-10-23 DIAGNOSIS — I471 Supraventricular tachycardia: Secondary | ICD-10-CM | POA: Diagnosis not present

## 2015-10-23 MED ORDER — METOPROLOL SUCCINATE ER 50 MG PO TB24: 50.0000 mg | ORAL_TABLET | ORAL | Status: DC | PRN

## 2015-10-23 NOTE — Progress Notes (Signed)
Patient ID: Bruce Guerra, male   DOB: 01-02-1966, 50 y.o.   MRN: [id number]     Cardiology Office Note   Date:  10/23/2015   ID:  Bruce AlarGeorge S Alberts, DOB 01-02-1966, MRN [id number]  PCP:  No PCP Per Patient  Cardiologist:   Charlton HawsPeter Nishan, MD   No chief complaint on file.     History of Present Illness: Bruce AlarGeorge S Kain is a 50 y.o. male who presents for evaluation of SVT  Seen in ER 03/2015 for tachycardia. Noted to be in SVT rate 200.  Converted with adenosine Indicated at that time he had had 4-5 previous episodes. Seen in ER 5/16 for dizziness rhythm stable thought to be vertigo.  Review of ECG;s shows no pre excitation And normal QT.  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    Requires DOT physical for license   Has a skin condition like psoriasis that is improved with methotrexate     Past Medical History  Diagnosis Date  . Allergy   . SVT (supraventricular tachycardia) (HCC)     No past surgical history on file.   Current Outpatient Prescriptions  Medication Sig Dispense Refill  . cetirizine (ZYRTEC) 10 MG tablet Take 10 mg by mouth daily.    . fexofenadine (ALLEGRA) 180 MG tablet Take 180 mg by mouth daily.    . fluticasone (FLONASE) 50 MCG/ACT nasal spray Place 2 sprays into both nostrils daily. 16 g 0  . meclizine (ANTIVERT) 12.5 MG tablet Take 1 tablet (12.5 mg total) by mouth 3 (three) times daily as needed for dizziness. 12 tablet 0  . methotrexate (RHEUMATREX) 10 MG tablet Take 10 mg by mouth once a week. Reported on 09/24/2015     No current facility-administered medications for this visit.    Allergies:   Review of patient's allergies indicates no known allergies.    Social History:  The patient  reports that he has never smoked. He  does not have any smokeless tobacco history on file. He reports that he does not drink alcohol or use illicit drugs.   Family History:  The patient's family history includes Diabetes in his mother; Hypertension in his father.    ROS:  Please see the history of present illness.   Otherwise, review of systems are positive for none.   All other systems are reviewed and negative.    PHYSICAL EXAM: VS:  BP 134/82 mmHg  Pulse 73  Ht 5\' 11"  (1.803 m)  Wt 107.049 kg (236 lb)  BMI 32.93 kg/m2  SpO2 98% , BMI Body mass index is 32.93 kg/(m^2). Affect appropriate Healthy:  appears stated age HEENT: normal Neck supple with no adenopathy JVP normal no bruits no thyromegaly Lungs clear with no wheezing and good diaphragmatic motion Heart:  S1/S2 no murmur, no rub, gallop or click PMI normal Abdomen: benighn, BS positve, no tenderness, no AAA no bruit.  No HSM or HJR Distal pulses intact with no bruits No edema Neuro non-focal Skin warm and dry No muscular weakness    EKG:   03/15/15  SR RSR' rate 97  QT 460  09/25/15  SR rate 65 RSR' QT 427   Recent Labs: 09/25/2015: BUN 15; Creatinine, Ser  1.03; Hemoglobin 14.1; Platelets 235; Potassium 4.1; Sodium 137    Lipid Panel No results found for: CHOL, TRIG, HDL, CHOLHDL, VLDL, LDLCALC, LDLDIRECT    Wt Readings from Last 3 Encounters:  10/23/15 107.049 kg (236 lb)  09/25/15 106.595 kg (235 lb)  09/24/15 107.049 kg (236 lb)      Other studies Reviewed: Additional studies/ records that were reviewed today include: ER records, labs ECG CT.    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 Derm: continue methotrexate labs ok in ER 3. Vertigo resolved PRN antivert discussed vestibular PT if recurs 4. Allergies:  Avoid zyrtec given history of tachycardia ok to use allegra  5. 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  The patient does not have concerns regarding medicines.  The following changes have been made:  no change  Labs/ tests ordered today include: None   No orders of the defined types were placed in this encounter.     Disposition:   FU with me in a year if ETT and Echo ok      Signed, Charlton Haws, MD  10/23/2015 3:29 PM    [facility] [address] [place], [place], [place]  [address] Phone: [phone]; Fax: [phone]

## 2015-10-23 NOTE — Patient Instructions (Addendum)
Medication Instructions:  Your physician has recommended you make the following change in your medication:  1-START Metoprolol 50 mg by mouth as need for palpitations.  Labwork: NONE  Testing/Procedures: Your physician has requested that you have an exercise tolerance test. For further information please visit [email or web address] Please also follow instruction sheet, as given.   Your physician has requested that you have an echocardiogram. Echocardiography is a painless test that uses sound waves to create images of your heart. It provides your doctor with information about the size and shape of your heart and how well your heart's chambers and valves are working. This procedure takes approximately one hour. There are no restrictions for this procedure.  Follow-Up: Your physician wants you to follow-up in: 12 months with Dr. Eden EmmsNishan. You will receive a reminder letter in the mail two months in advance. If you don't receive a letter, please call our office to schedule the follow-up appointment.   If you need a refill on your cardiac medications before your next appointment, please call your pharmacy.

## 2015-11-09 ENCOUNTER — Telehealth (HOSPITAL_COMMUNITY): Payer: Self-pay | Admitting: Cardiovascular Disease

## 2015-11-14 ENCOUNTER — Ambulatory Visit (HOSPITAL_COMMUNITY): Payer: BLUE CROSS/BLUE SHIELD | Attending: Cardiovascular Disease

## 2015-11-14 ENCOUNTER — Other Ambulatory Visit: Payer: Self-pay

## 2015-11-14 DIAGNOSIS — I471 Supraventricular tachycardia: Secondary | ICD-10-CM | POA: Insufficient documentation

## 2015-11-14 DIAGNOSIS — R0600 Dyspnea, unspecified: Secondary | ICD-10-CM | POA: Insufficient documentation

## 2015-11-14 DIAGNOSIS — I517 Cardiomegaly: Secondary | ICD-10-CM | POA: Insufficient documentation

## 2015-11-14 LAB — ECHOCARDIOGRAM COMPLETE
E decel time: 180 msec
EERAT: 7.75
FS: 29 % (ref 28–44)
IV/PV OW: 1.01
LA diam index: 1.75 cm/m2
LA vol A4C: 60 ml
LA vol: 65 mL
LASIZE: 41 mm
LAVOLIN: 27.7 mL/m2
LDCA: 4.52 cm2
LEFT ATRIUM END SYS DIAM: 41 mm
LV E/e' medial: 7.75
LV PW d: 12.3 mm — AB (ref 0.6–1.1)
LV TDI E'LATERAL: 7.77
LV TDI E'MEDIAL: 7.64
LV e' LATERAL: 7.77 cm/s
LVEEAVG: 7.75
LVOT VTI: 17.2 cm
LVOT peak grad rest: 3 mmHg
LVOT peak vel: 91.1 cm/s
LVOTD: 24 mm
LVOTSV: 78 mL
MV Dec: 180
MV pk A vel: 61.1 m/s
MV pk E vel: 60.2 m/s

## 2015-11-14 NOTE — Telephone Encounter (Signed)
Close encounter 

## 2015-11-15 ENCOUNTER — Telehealth: Payer: Self-pay

## 2015-11-15 DIAGNOSIS — R931 Abnormal findings on diagnostic imaging of heart and coronary circulation: Secondary | ICD-10-CM

## 2015-11-15 DIAGNOSIS — Z01812 Encounter for preprocedural laboratory examination: Secondary | ICD-10-CM

## 2015-11-15 MED ORDER — LOSARTAN POTASSIUM 25 MG PO TABS: 25.0000 mg | ORAL_TABLET | Freq: Every day | ORAL | Status: DC

## 2015-11-15 NOTE — Telephone Encounter (Addendum)
Called patient will echo results. Per Dr. Fabio BeringNishan's result note. Ordered Patient's MRI, BMET, and Cozaar 25 mg. Sent message to precert and scheduling to make appointments. Patient is aware someone will be calling him to make an appointment for MRI and BMET prior.    ----- Message from Wendall StadePeter C Nishan, MD sent at 11/15/2015 10:22 AM EDT ----- EF mildly decreased needs f/u cardiac MRI had normal BMET in may but will likely need another Start cozaar 25 mg and f/u with me after MRI

## 2015-11-19 ENCOUNTER — Encounter: Payer: Self-pay | Admitting: Cardiovascular Disease

## 2015-12-05 ENCOUNTER — Ambulatory Visit (HOSPITAL_COMMUNITY)
Admission: RE | Admit: 2015-12-05 | Discharge: 2015-12-05 | Disposition: A | Discharge status: Home / Self Care | Payer: BLUE CROSS/BLUE SHIELD | Source: Ambulatory Visit | Attending: Cardiovascular Disease | Admitting: Cardiovascular Disease

## 2015-12-05 DIAGNOSIS — R931 Abnormal findings on diagnostic imaging of heart and coronary circulation: Secondary | ICD-10-CM | POA: Diagnosis not present

## 2015-12-05 DIAGNOSIS — I429 Cardiomyopathy, unspecified: Secondary | ICD-10-CM | POA: Diagnosis not present

## 2015-12-05 DIAGNOSIS — I517 Cardiomegaly: Secondary | ICD-10-CM | POA: Diagnosis not present

## 2015-12-05 LAB — CREATININE, SERUM
Creatinine, Ser: 1.07 mg/dL (ref 0.61–1.24)
GFR calc Af Amer: >60 mL/min (ref >60)
GFR calc non Af Amer: >60 mL/min (ref >60)

## 2015-12-05 MED ORDER — GADOBENATE DIMEGLUMINE 529 MG/ML IV SOLN
36.0000 mL | Freq: Once | INTRAVENOUS | Status: AC | PRN
  Administered 2015-12-05: 36 mL via INTRAVENOUS

## 2015-12-18 ENCOUNTER — Ambulatory Visit: Payer: BLUE CROSS/BLUE SHIELD | Admitting: Cardiovascular Disease

## 2015-12-25 ENCOUNTER — Encounter: Payer: Self-pay | Admitting: Interventional Cardiology

## 2015-12-25 ENCOUNTER — Other Ambulatory Visit: Payer: Self-pay | Admitting: Cardiovascular Disease

## 2015-12-25 ENCOUNTER — Ambulatory Visit: Payer: BLUE CROSS/BLUE SHIELD

## 2015-12-25 ENCOUNTER — Other Ambulatory Visit: Payer: Self-pay

## 2015-12-25 DIAGNOSIS — I471 Supraventricular tachycardia: Secondary | ICD-10-CM

## 2015-12-25 DIAGNOSIS — R9431 Abnormal electrocardiogram [ECG] [EKG]: Secondary | ICD-10-CM

## 2015-12-25 DIAGNOSIS — R0600 Dyspnea, unspecified: Secondary | ICD-10-CM

## 2015-12-28 ENCOUNTER — Other Ambulatory Visit: Payer: Self-pay

## 2015-12-28 DIAGNOSIS — R0600 Dyspnea, unspecified: Secondary | ICD-10-CM

## 2015-12-28 DIAGNOSIS — R9431 Abnormal electrocardiogram [ECG] [EKG]: Secondary | ICD-10-CM

## 2015-12-31 ENCOUNTER — Telehealth (HOSPITAL_COMMUNITY): Payer: Self-pay | Admitting: *Deleted

## 2015-12-31 NOTE — Telephone Encounter (Signed)
Patient given detailed instructions per Myocardial Perfusion Study Information Sheet for the test on 01/01/16 at 0745. Patient notified to arrive 15 minutes early and that it is imperative to arrive on time for appointment to keep from having the test rescheduled.  If you need to cancel or reschedule your appointment, please call the office within 24 hours of your appointment. Failure to do so may result in a cancellation of your appointment, and a $50 no show fee. Patient verbalized understanding.Wright Gravely W    

## 2016-01-01 ENCOUNTER — Ambulatory Visit (HOSPITAL_COMMUNITY): Payer: BLUE CROSS/BLUE SHIELD | Attending: Cardiology

## 2016-01-01 DIAGNOSIS — R0602 Shortness of breath: Secondary | ICD-10-CM | POA: Insufficient documentation

## 2016-01-01 DIAGNOSIS — R9431 Abnormal electrocardiogram [ECG] [EKG]: Secondary | ICD-10-CM

## 2016-01-01 DIAGNOSIS — R0609 Other forms of dyspnea: Secondary | ICD-10-CM | POA: Insufficient documentation

## 2016-01-01 DIAGNOSIS — R0600 Dyspnea, unspecified: Secondary | ICD-10-CM | POA: Insufficient documentation

## 2016-01-01 DIAGNOSIS — R002 Palpitations: Secondary | ICD-10-CM | POA: Insufficient documentation

## 2016-01-01 LAB — MYOCARDIAL PERFUSION IMAGING
CHL CUP NUCLEAR SDS: 0
CHL CUP NUCLEAR SRS: 1
CHL CUP RESTING HR STRESS: 74 {beats}/min
LHR: 0.32
LV dias vol: 115 mL (ref 62–150)
LVSYSVOL: 56 mL
Peak HR: 110 {beats}/min
SSS: 1
TID: 1.06

## 2016-01-01 MED ORDER — TECHNETIUM TC 99M TETROFOSMIN IV KIT
32.9000 | PACK | Freq: Once | INTRAVENOUS | Status: AC | PRN
  Administered 2016-01-01: 32.9 via INTRAVENOUS
  Filled 2016-01-01: qty 33

## 2016-01-01 MED ORDER — REGADENOSON 0.4 MG/5ML IV SOLN
0.4000 mg | Freq: Once | INTRAVENOUS | Status: AC
  Administered 2016-01-01: 0.4 mg via INTRAVENOUS

## 2016-01-01 MED ORDER — TECHNETIUM TC 99M TETROFOSMIN IV KIT
10.2000 | PACK | Freq: Once | INTRAVENOUS | Status: AC | PRN
  Administered 2016-01-01: 10 via INTRAVENOUS
  Filled 2016-01-01: qty 10

## 2016-02-13 NOTE — Progress Notes (Deleted)
Patient ID: Bruce Guerra, male   DOB: [birth year], 50 y.o.   MRN: [id number]     Cardiology Office Note   Date:  02/14/2016   ID:  Bruce Guerra, DOB Jan 11, 1966, MRN [id number]  PCP:  No PCP Per Patient  Cardiologist:   Charlton Haws, MD   No chief complaint on file.     History of Present Illness: Bruce Guerra is a 50 y.o. male first  Evaluated for  SVT 10/23/15   Seen in ER 03/2015 for tachycardia. Noted to be in SVT rate 200.  Converted with adenosine Indicated at that time he had had 4-5 previous episodes. Seen in ER 5/16 for dizziness rhythm stable thought to be vertigo.  Review of ECG;s shows no pre excitation And normal QT.  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    Requires DOT physical for license   Has a skin condition like psoriasis that is improved with methotrexate   Echo:  11/14/15  EF 45-50% Myovue 01/01/16  EF 52% small septal defect not thought to be significant no ischemia  MRI MIld LVE with EF 60% no scar or infarct on post gad images    Past Medical History:  Diagnosis Date  . Allergy   . SVT (supraventricular tachycardia) (HCC)     No past surgical history on file.   Current Outpatient Prescriptions  Medication Sig Dispense Refill  . cetirizine (ZYRTEC) 10 MG tablet Take 10 mg by mouth daily.    . fexofenadine (ALLEGRA) 180 MG tablet Take 180 mg by mouth daily.    . fluticasone (FLONASE) 50 MCG/ACT nasal spray Place 2 sprays into both nostrils daily. 16 g 0  . losartan (COZAAR) 25 MG tablet Take 1 tablet (25 mg total) by mouth daily. 90 tablet 3  . meclizine (ANTIVERT) 12.5 MG tablet Take 1 tablet (12.5 mg total) by mouth 3 (three) times daily as needed for dizziness. 12 tablet 0  . methotrexate (RHEUMATREX) 10 MG tablet Take  10 mg by mouth once a week. Reported on 09/24/2015    . metoprolol succinate (TOPROL-XL) 50 MG 24 hr tablet Take 1 tablet (50 mg total) by mouth as needed (palpitations). Take with or immediately following a meal. 30 tablet 11   No current facility-administered medications for this visit.     Allergies:   Review of patient's allergies indicates no known allergies.    Social History:  The patient  reports that he has never smoked. He does not have any smokeless tobacco history on file. He reports that he does not drink alcohol or use drugs.   Family History:  The patient's family history includes Diabetes in his mother; Hypertension in his father.    ROS:  Please see the history of present illness.   Otherwise, review of systems are positive for none.   All other systems are reviewed and negative.    PHYSICAL EXAM: VS:  There were no vitals taken for this visit. , BMI There is no height or weight on file to calculate BMI. Affect appropriate Healthy:  appears stated age HEENT: normal Neck supple with no adenopathy JVP normal no bruits no thyromegaly Lungs clear with no wheezing and good diaphragmatic motion  Heart:  S1/S2 no murmur, no rub, gallop or click PMI normal Abdomen: benighn, BS positve, no tenderness, no AAA no bruit.  No HSM or HJR Distal pulses intact with no bruits No edema Neuro non-focal Skin warm and dry No muscular weakness    EKG:   03/15/15  SR RSR' rate 97  QT 460  09/25/15  SR rate 65 RSR' QT 427   Recent Labs: 09/25/2015: BUN 15; Hemoglobin 14.1; Platelets 235; Potassium 4.1; Sodium 137 12/05/2015: Creatinine, Ser 1.07    Lipid Panel No results found for: CHOL, TRIG, HDL, CHOLHDL, VLDL, LDLCALC, LDLDIRECT    Wt Readings from Last 3 Encounters:  01/01/16 107 kg (236 lb)  10/23/15 107 kg (236 lb)  09/25/15 106.6 kg (235 lb)      Other studies Reviewed: Additional studies/ records that were reviewed today include: ER records, labs ECG  CT.    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 Derm: continue methotrexate labs ok in ER 3. Vertigo resolved PRN antivert discussed vestibular PT if recurs 4. Allergies:  Avoid zyrtec given history of tachycardia ok to use allegra  5. 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  The patient does not have concerns regarding medicines.  The following changes have been made:  no change  Labs/ tests ordered today include: None   No orders of the defined types were placed in this encounter.    Disposition:   FU with me in a year if ETT and Echo ok      Signed, Charlton HawsPeter Devery Odwyer, MD  02/14/2016 9:24 AM    [facility] [address], [place], [place]  [address] Phone: [phone]; Fax: [phone]

## 2016-02-14 ENCOUNTER — Ambulatory Visit: Payer: BLUE CROSS/BLUE SHIELD | Admitting: Cardiovascular Disease

## 2016-02-20 ENCOUNTER — Encounter: Payer: Self-pay | Admitting: Cardiovascular Disease

## 2016-11-19 ENCOUNTER — Ambulatory Visit (INDEPENDENT_AMBULATORY_CARE_PROVIDER_SITE_OTHER): Payer: Self-pay | Admitting: Emergency Medicine

## 2016-11-19 ENCOUNTER — Encounter: Payer: Self-pay | Admitting: Emergency Medicine

## 2016-11-19 VITALS — BP 118/76 | HR 87 | Temp 98.5°F | Resp 16 | Ht 70.0 in | Wt 230.8 lb

## 2016-11-19 DIAGNOSIS — Z024 Encounter for examination for driving license: Secondary | ICD-10-CM

## 2016-11-19 NOTE — Patient Instructions (Signed)
     IF you received an x-ray today, you will receive an invoice from [facility]. Please contact [facility] at [phone] with questions or concerns regarding your invoice.   IF you received labwork today, you will receive an invoice from [facility]. Please contact [facility] at [phone] with questions or concerns regarding your invoice.   Our billing staff will not be able to assist you with questions regarding bills from these companies.  You will be contacted with the lab results as soon as they are available. The fastest way to get your results is to activate your My Chart account. Instructions are located on the last page of this paperwork. If you have not heard from us regarding the results in 2 weeks, please contact this office.     

## 2016-11-19 NOTE — Progress Notes (Signed)
This patient presents for DOT examination for fitness for duty.   Medical History:  1. Head/Brain Injuries, disorders or illnesses no  2. Seizures, epilepsy no  3. Eye disorders or impaired vision (except corrective lenses) no  4. Ear disorders, loss of hearing or balance no  5. Heart disease or heart attack, other cardiovascular condition no  6. Heart surgery (valve replacement/bypass, angioplasty, pacemaker/defribrillator) no  7. High blood pressure no  8. High cholesterol no  9. Chronic cough, shortness of breath or other breathing problems no  10. Lung disease (emphysema, asthma or chronic bronchitis) no  11. Kidney disease, dialysis no  12. Digestive problems  no  13. Diabetes or elevated blood sugar no                      If yes to #13, Insulin use no  14. Nervious or psychiatric disorders, e.g., severe depression no  15. Fainting or syncope no  16. Dizziness, headaches, numbness, tingling or memory loss no  17. Unexplained weight loss no  18. Stroke, TIA or paralysis no  19. Missing or impaired hand, arm, foot, leg, finger, toe no  20. Spinal injury or disease no  21. Bone, muscles or nerve problems no  22. Blood clots or bleeding bleeding disorders no  23. Cancer no  24. Chronic infection or other chronic diseases no  25. Sleep disorders, pauses in breathing while asleep, daytime sleepiness, loud snoring no  26. Have you ever had a sleep test? no  27.  Have you ever spent a night in the hospital? no  28. Have you ever had a broken bone? no  29. Have you or or do you use tobacco products? no  30. Regular, frequent alcohol use no  31. Illegal substance use within the past 2 years no  32.  Have you ever failed a drug test or been dependent on an illegal substance? no   Current Medications: Prior to Admission medications   Medication Sig Start Date End Date Taking? Authorizing Provider  cetirizine (ZYRTEC) 10 MG tablet Take 10 mg by mouth daily.   Yes [provider]  fexofenadine (ALLEGRA) 180 MG tablet Take 180 mg by mouth daily.   Yes [provider]  metoprolol succinate (TOPROL-XL) 50 MG 24 hr tablet Take 1 tablet (50 mg total) by mouth as needed (palpitations). Take with or immediately following a meal. 10/23/15  Yes Wendall Stade, MD  fluticasone (FLONASE) 50 MCG/ACT nasal spray Place 2 sprays into both nostrils daily. Patient not taking: Reported on 11/19/2016 09/25/15   Palumbo, April, MD  losartan (COZAAR) 25 MG tablet Take 1 tablet (25 mg total) by mouth daily. Patient not taking: Reported on 11/19/2016 11/15/15   Wendall Stade, MD  meclizine (ANTIVERT) 12.5 MG tablet Take 1 tablet (12.5 mg total) by mouth 3 (three) times daily as needed for dizziness. Patient not taking: Reported on 11/19/2016 09/25/15   Palumbo, April, MD  methotrexate (RHEUMATREX) 10 MG tablet Take 10 mg by mouth once a week. Reported on 09/24/2015    [provider]    Medical Examiner's Comments on Health History:  In good general medical condition.  TESTING:   Visual Acuity Screening   Right eye Left eye Both eyes  Without correction:     With correction: 20/25 20/25 20/25   Comments: Colors: 6/6 Periph: eyes-  R  85 degree L 85 degrees  Hearing Screening Comments: WHISPER TEST:  10  FT  (R)                                10 FT  (L)  Monocular Vision: No.  Hearing Aid used for test: No. Hearing Aid required to to meet standard: No.  BP 118/76 (BP Location: Right Arm, Patient Position: Sitting, Cuff Size: Large)   Pulse 87   Temp 98.5 F (36.9 C) (Oral)   Resp 16   Ht 5\' 10"  (1.778 m)   Wt 230 lb 12.8 oz (104.7 kg)   SpO2 97%   BMI 33.12 kg/m  Pulse rate is regular  Comments: U/A - spg: 1.025, protein: trace, blood: negative, glucose: negative  PHYSICAL EXAMINATION:  General Appearance Not markedly obese. No tremor, signs of alcoholism, problem drinking or drug abuse.   Skin Warm, dry and intact.  Eyes Pupils are equal,  round and reactive to light and accommodation, extraocular movements are intact. No exophthalmos, no nystagmus.  Ears Normal external ears. External canal without occlusion. No scarring of the TM. No perforation of the TM.  Mouth and Throat Clear and moist. No irremedial deformities likely to interfere with breathing or swallowing.  Heart No murmurs, extra sounds, evidence of cardiomegaly. No pacemaker. No implantable defibrillator.  Lungs and Chest (excluding breasts) Normal chest expansion, respiratory rate, breath sounds. No cyanosis.  Abdomen and Viscera No liver enlargement. No splenic enlargement. No masses, bruits, hernias or significant abdominal wall weakness.  Genitourinary  No inguinal or femoral hernia.  Spine and other musculoskeletal No tenderness, no limitation of motion, no deformities.  evidence of previous surgery.  Extremities No loss or impairment of leg, foot, toe, arm, hand, finger. No perceptible limp, deformities, atrophy, weakness, paralysis, clubbing, edema, hypotonia. Patient has sufficient grasp and prehension to maintain steering wheel grip. Patient has sufficient mobility and strength in the lower limbs to operate pedals properly.  Neurologic Normal equilibrium, coordination, speech pattern. No paresthesia, asymmetry of deep tendon reflexes, sensory or positional abnormalities. No abnormality of patellar or Babinski's reflexes.  Gait Not antalgic or ataxic  Vascular Normal pulses. No carotid or arterial bruits. No varicose veins.    Certification Status: does meet standards for 2 year certificate.  Certification expires 11/20/2018

## 2017-05-21 IMAGING — CR DG CHEST 2V
2 series · 2 of 2 positions shown · non-contrast
Comparison: 03/15/2015

CLINICAL DATA: Pt c/o dizziness after eating vanilla pudding last
pm, started around 9906 but has got better since he came here

EXAM:
CHEST  2 VIEW

[w chest pa]
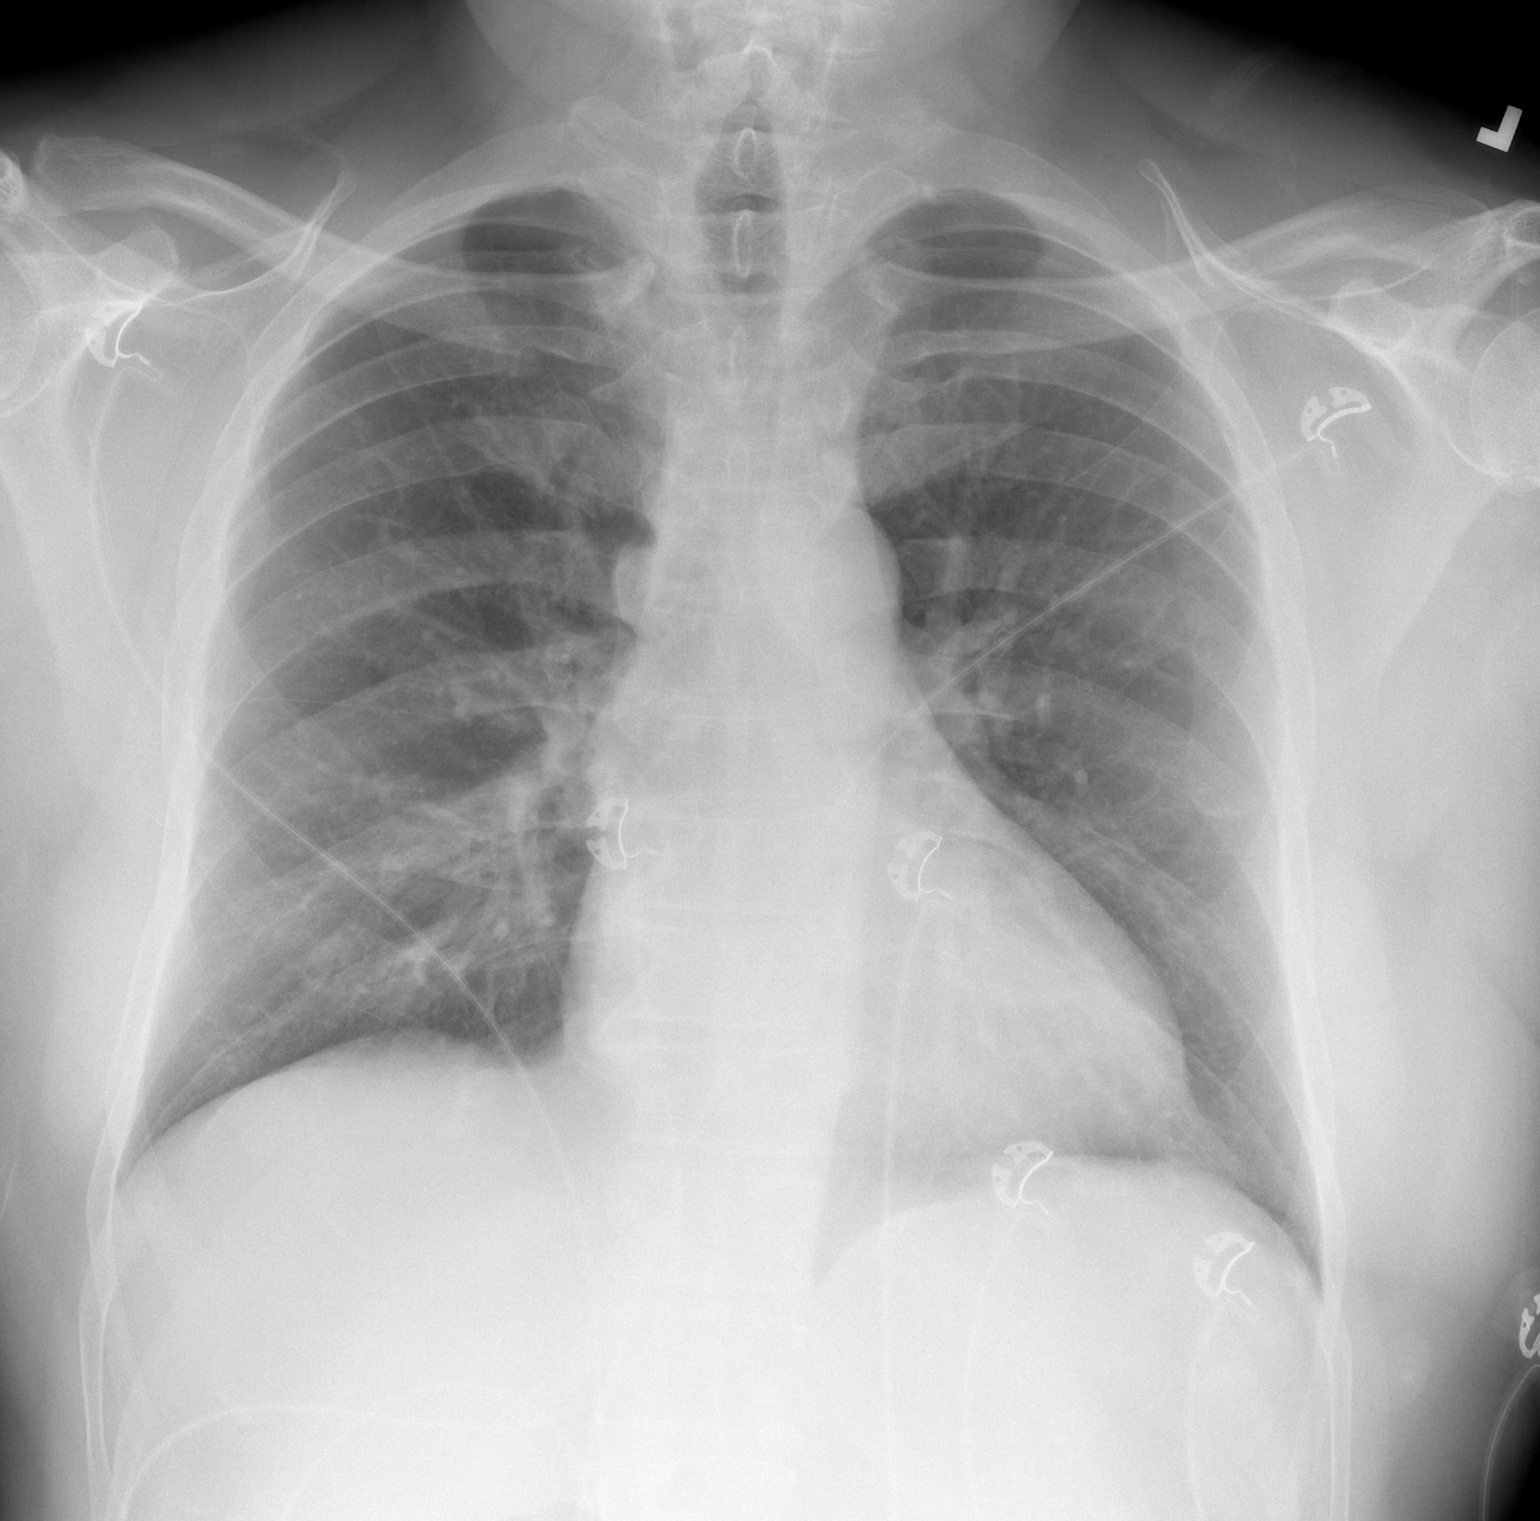

[w chest lat]
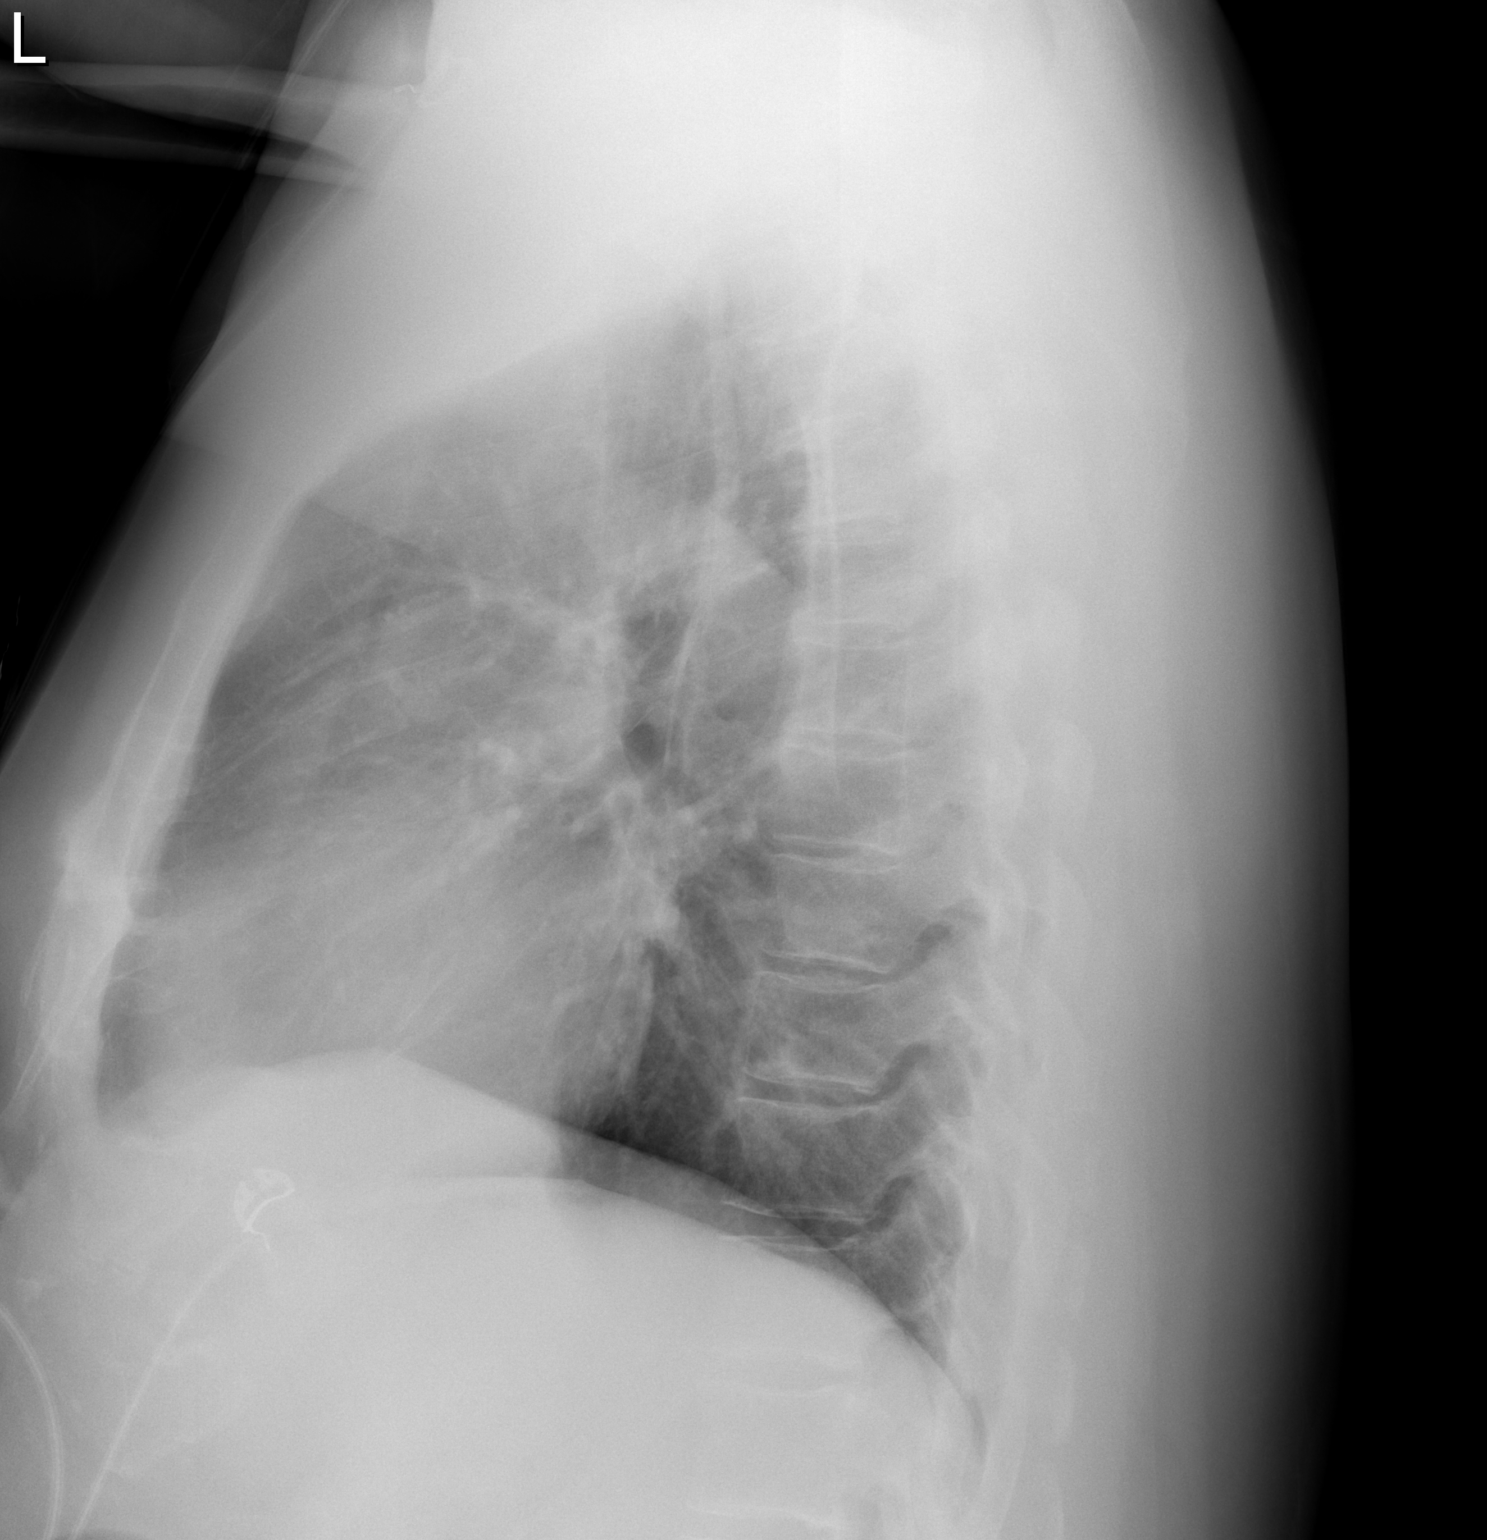

[2 of 2 positions shown; findings below may reference images not displayed]

FINDINGS: The heart size and mediastinal contours are within normal limits.
Both lungs are clear. The visualized skeletal structures are
unremarkable.
IMPRESSION: No active cardiopulmonary disease.

## 2017-05-21 IMAGING — CT CT HEAD W/O CM
1 series · 16 of 30 positions shown, 20 images · non-contrast
Comparison: None.

CLINICAL DATA: Dizziness after eating vanilla pudding last night,
now improving.

EXAM:
CT HEAD WITHOUT CONTRAST
TECHNIQUE: Contiguous axial images were obtained from the base of the skull
through the vertex without intravenous contrast.

[Series 2: head wo · axial · 0.49mm/px · z∈[-116,+24]mm · 16 of 33 slices shown, 20 images]
[im 2/33  brain]
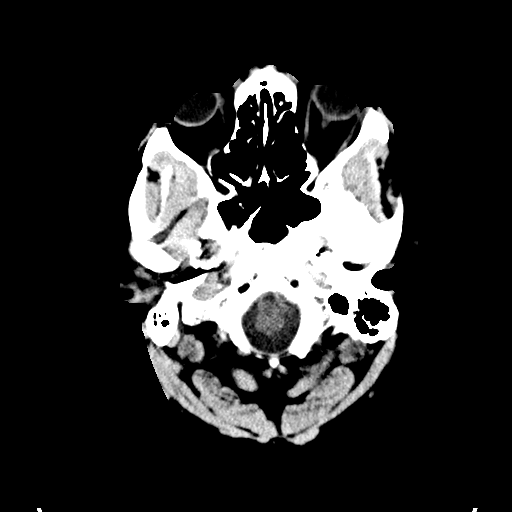
[im 2/33  bone]
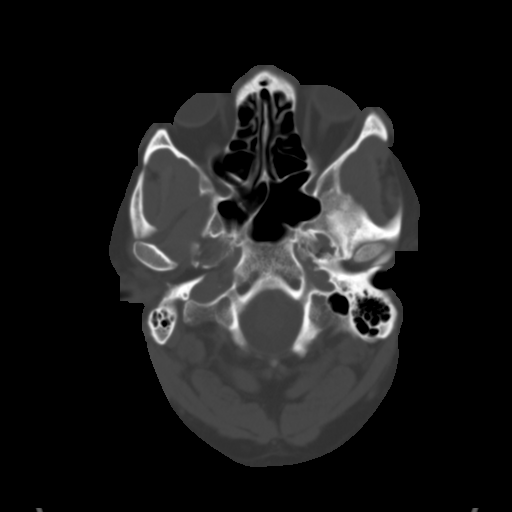
[im 4/33  brain]
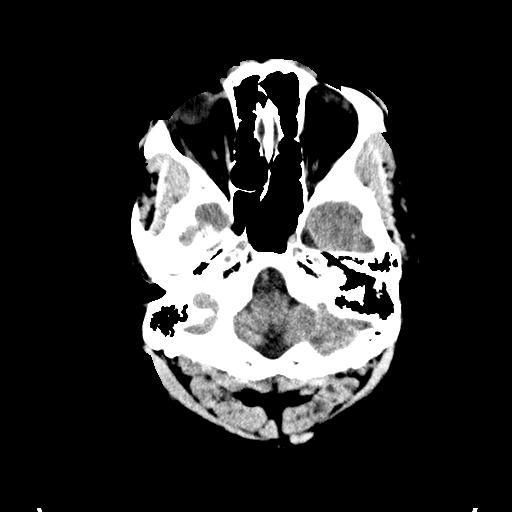
[im 6/33  brain]
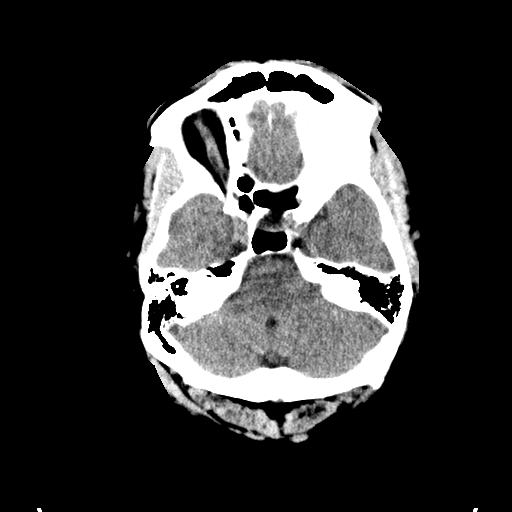
[im 8/33  brain]
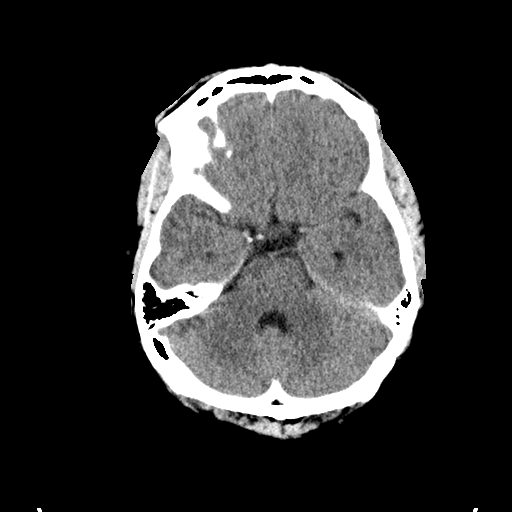
[im 9/33  brain]
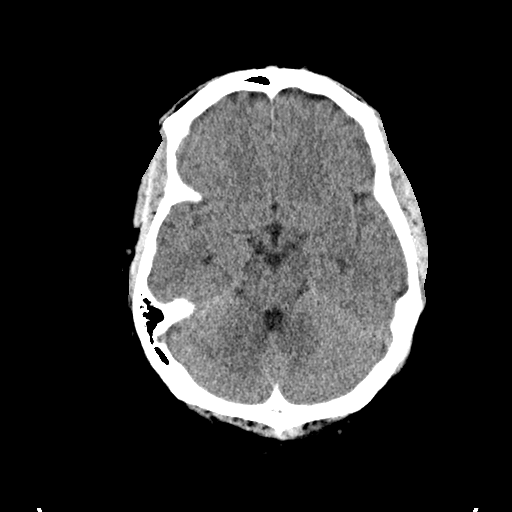
[im 9/33  bone]
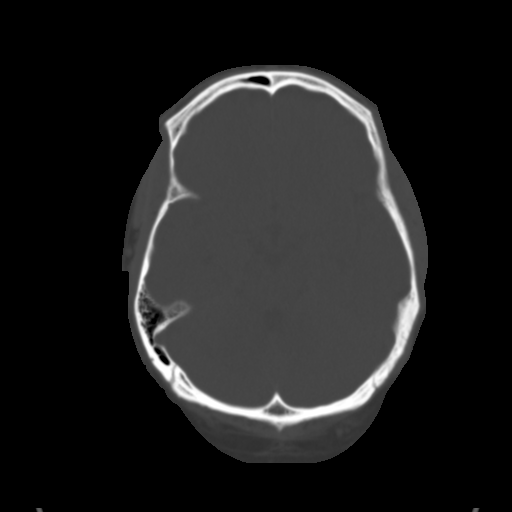
[im 12/33  brain]
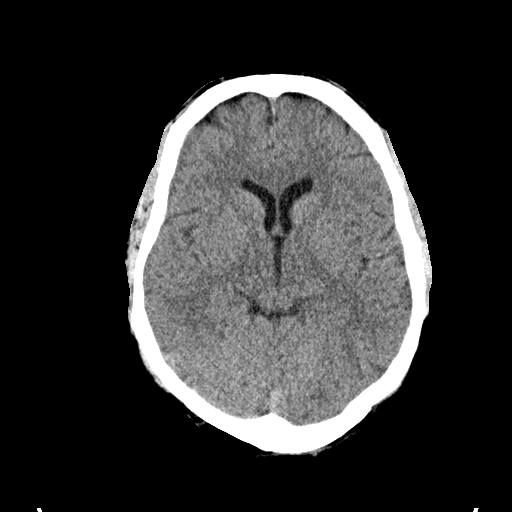
[im 14/33  brain]
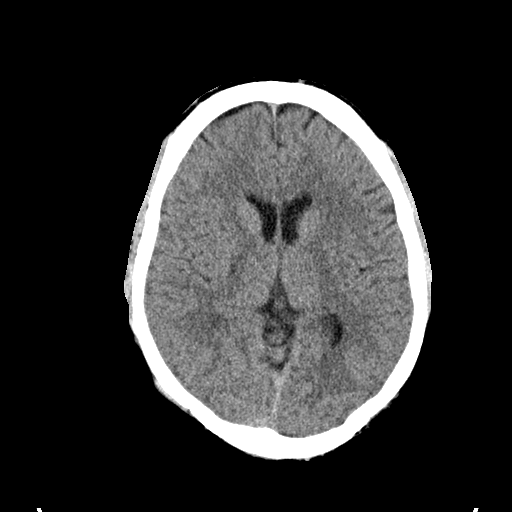
[im 16/33  brain]
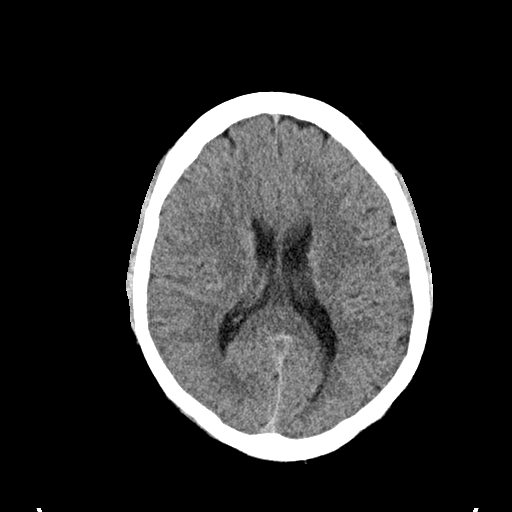
[im 17/33  brain]
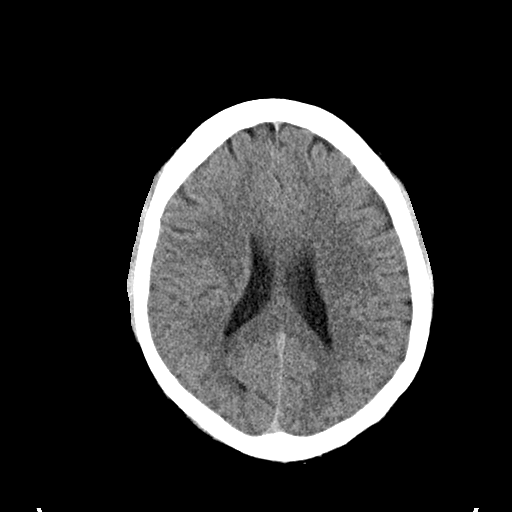
[im 17/33  bone]
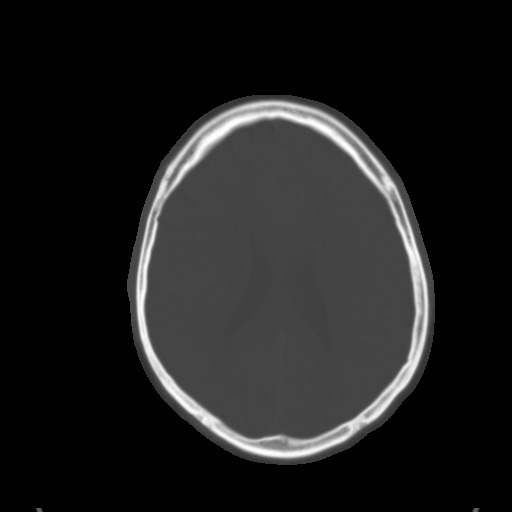
[im 19/33  brain]
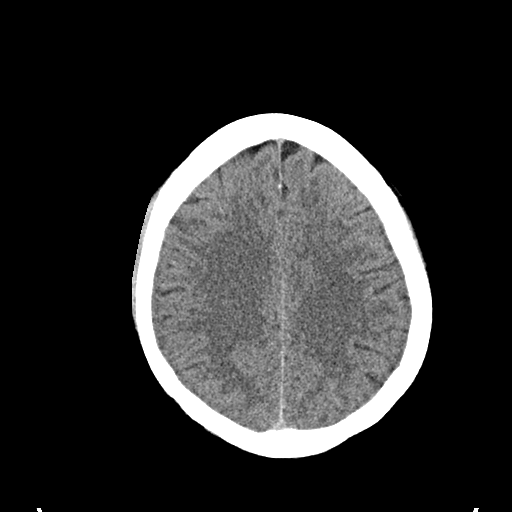
[im 21/33  brain]
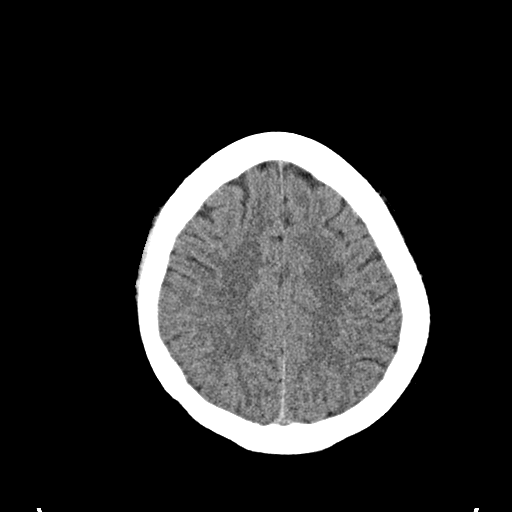
[im 24/33  brain]
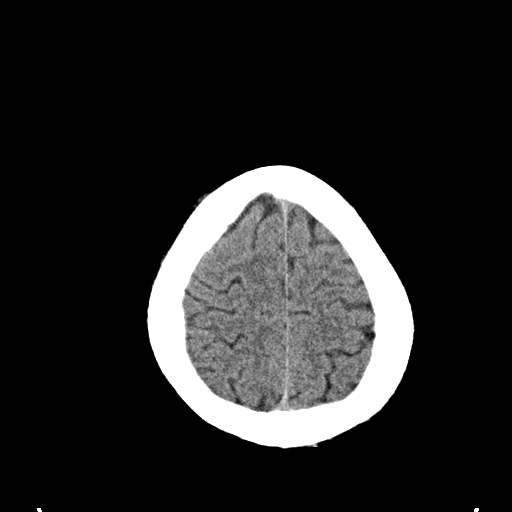
[im 25/33  brain]
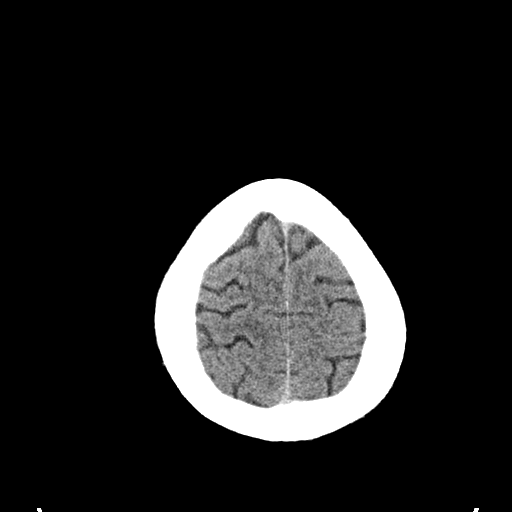
[im 25/33  bone]
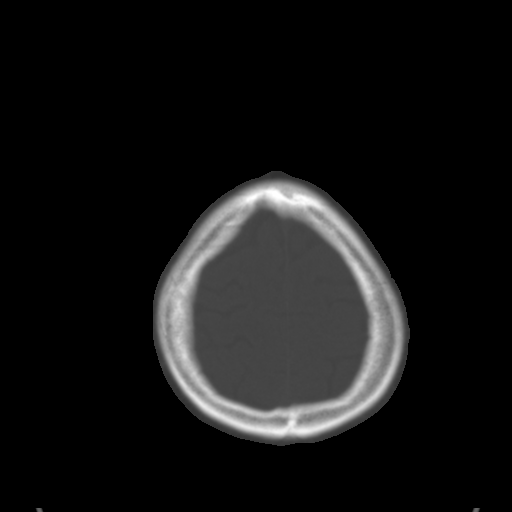
[im 27/33  brain]
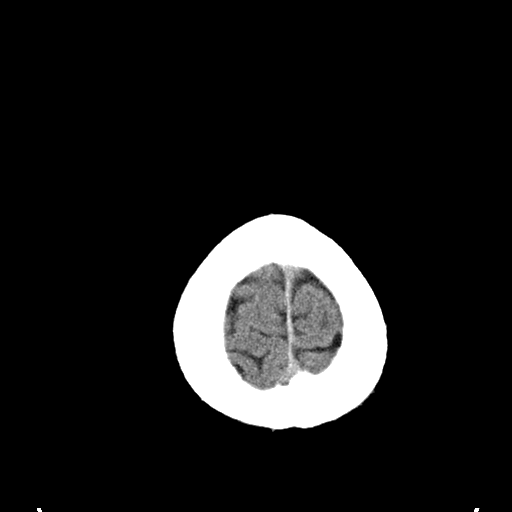
[im 29/33  brain]
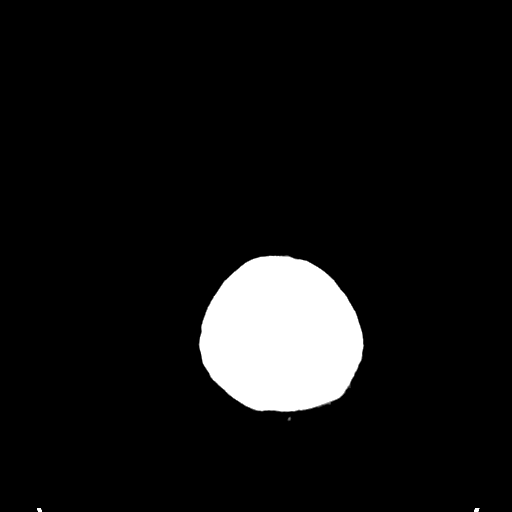
[im 31/33  brain]
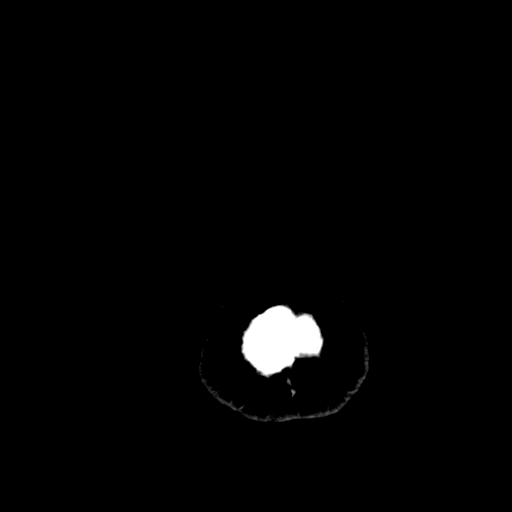

[16 of 30 positions shown; findings below may reference images not displayed]

FINDINGS: INTRACRANIAL CONTENTS: The ventricles and sulci are normal. No
intraparenchymal hemorrhage, mass effect nor midline shift. No acute
large vascular territory infarcts. No abnormal extra-axial fluid
collections. Basal cisterns are patent.

ORBITS: The included ocular globes and orbital contents are normal.

SINUSES: The mastoid aircells and included paranasal sinuses are
well-aerated.

SKULL/SOFT TISSUES: No skull fracture. No significant soft tissue
swelling.
IMPRESSION: Normal CT HEAD.

## 2019-04-21 ENCOUNTER — Encounter: Payer: Self-pay | Admitting: Emergency Medicine

## 2019-12-03 ENCOUNTER — Ambulatory Visit: Payer: Self-pay | Attending: Internal Medicine

## 2019-12-03 DIAGNOSIS — Z23 Encounter for immunization: Secondary | ICD-10-CM

## 2019-12-03 NOTE — Progress Notes (Signed)
° °  Covid-19 Vaccination Clinic  Name:  Bruce Guerra    MRN: [id number] DOB: 1965-07-05  12/03/2019  [name] was observed post Covid-19 immunization for 15 minutes without incident. He was provided with Vaccine Information Sheet and instruction to access the V-Safe system.   [name] was instructed to call [phone] with any severe reactions post vaccine:  Difficulty breathing   Swelling of face and throat   A fast heartbeat   A bad rash all over body   Dizziness and weakness   Immunizations Administered    Name Date Dose VIS Date Route   Pfizer COVID-19 Vaccine 12/03/2019 11:04 AM 0.3 mL 07/06/2018 Intramuscular   Manufacturer: [employer], [employer]   Lot: [id number]   NDC: [address]-0

## 2020-08-16 ENCOUNTER — Other Ambulatory Visit: Payer: Self-pay

## 2020-08-16 ENCOUNTER — Ambulatory Visit (HOSPITAL_COMMUNITY)
Admission: EM | Admit: 2020-08-16 | Discharge: 2020-08-16 | Disposition: A | Discharge status: Home / Self Care | Payer: BLUE CROSS/BLUE SHIELD | Attending: Urgent Care | Admitting: Urgent Care

## 2020-08-16 ENCOUNTER — Encounter (HOSPITAL_COMMUNITY): Payer: Self-pay

## 2020-08-16 DIAGNOSIS — R110 Nausea: Secondary | ICD-10-CM | POA: Diagnosis not present

## 2020-08-16 DIAGNOSIS — Z87442 Personal history of urinary calculi: Secondary | ICD-10-CM

## 2020-08-16 DIAGNOSIS — R39198 Other difficulties with micturition: Secondary | ICD-10-CM | POA: Diagnosis not present

## 2020-08-16 DIAGNOSIS — R109 Unspecified abdominal pain: Secondary | ICD-10-CM

## 2020-08-16 LAB — POCT URINALYSIS DIPSTICK, ED / UC
Bilirubin Urine: NEGATIVE
Glucose, UA: NEGATIVE mg/dL
Hgb urine dipstick: NEGATIVE
Ketones, ur: NEGATIVE mg/dL
Leukocytes,Ua: NEGATIVE
Nitrite: NEGATIVE
Protein, ur: NEGATIVE mg/dL
Specific Gravity, Urine: 1.025 (ref 1.005–1.030)
Urobilinogen, UA: 1 mg/dL (ref 0.0–1.0)
pH: 5 (ref 5.0–8.0)

## 2020-08-16 MED ORDER — TAMSULOSIN HCL 0.4 MG PO CAPS: 0.4000 mg | ORAL_CAPSULE | Freq: Every day | ORAL | 0 refills | Status: DC

## 2020-08-16 NOTE — ED Provider Notes (Signed)
[facility]   MRN: [id number] DOB: 04/01/1966  Subjective:   Bruce Guerra is a 55 y.o. male presenting for several day history of recurrent difficulty urinating, weakened flow, 1 episode of nausea and vomiting last night.  Patient has a history of renal stones, last episode was 6 months ago.  He passed it without any procedure.  Denies fevers, abdominal or flank tenderness, hematuria, dysuria, urinary frequency.  Patient states that he has had his prostate checked and did not have any issues.  No current facility-administered medications for this encounter.  Current Outpatient Medications:  .  cetirizine (ZYRTEC) 10 MG tablet, Take 10 mg by mouth daily., Disp: , Rfl:  .  fexofenadine (ALLEGRA) 180 MG tablet, Take 180 mg by mouth daily., Disp: , Rfl:  .  fluticasone (FLONASE) 50 MCG/ACT nasal spray, Place 2 sprays into both nostrils daily. (Patient not taking: Reported on 11/19/2016), Disp: 16 g, Rfl: 0 .  losartan (COZAAR) 25 MG tablet, Take 1 tablet (25 mg total) by mouth daily. (Patient not taking: Reported on 11/19/2016), Disp: 90 tablet, Rfl: 3 .  meclizine (ANTIVERT) 12.5 MG tablet, Take 1 tablet (12.5 mg total) by mouth 3 (three) times daily as needed for dizziness. (Patient not taking: Reported on 11/19/2016), Disp: 12 tablet, Rfl: 0 .  methotrexate (RHEUMATREX) 10 MG tablet, Take 10 mg by mouth once a week. Reported on 09/24/2015, Disp: , Rfl:  .  metoprolol succinate (TOPROL-XL) 50 MG 24 hr tablet, Take 1 tablet (50 mg total) by mouth as needed (palpitations). Take with or immediately following a meal., Disp: 30 tablet, Rfl: 11   No Known Allergies  Past Medical History:  Diagnosis Date  . Allergy   . SVT (supraventricular tachycardia) (HCC)      History reviewed. No pertinent surgical history.  Family History  Problem Relation Age of Onset  . Diabetes Mother   . Hypertension Father     Social History   Tobacco Use  . Smoking status: Never Smoker  .  Smokeless tobacco: Never Used  Substance Use Topics  . Alcohol use: No  . Drug use: No    ROS   Objective:   Vitals: BP (!) 165/103 (BP Location: Left Arm)   Pulse 72   Temp 98.5 F (36.9 C) (Oral)   Resp 17   SpO2 100%   Physical Exam Constitutional:      General: He is not in acute distress.    Appearance: Normal appearance. He is well-developed and normal weight. He is not ill-appearing, toxic-appearing or diaphoretic.  HENT:     Head: Normocephalic and atraumatic.     Right Ear: External ear normal.     Left Ear: External ear normal.     Nose: Nose normal.     Mouth/Throat:     Pharynx: Oropharynx is clear.  Eyes:     General: No scleral icterus.       Right eye: No discharge.        Left eye: No discharge.     Extraocular Movements: Extraocular movements intact.     Pupils: Pupils are equal, round, and reactive to light.  Cardiovascular:     Rate and Rhythm: Normal rate.  Pulmonary:     Effort: Pulmonary effort is normal.  Abdominal:     General: Bowel sounds are normal. There is no distension.     Palpations: Abdomen is soft. There is no mass.     Tenderness: There is no abdominal  tenderness. There is no right CVA tenderness, left CVA tenderness, guarding or rebound.  Musculoskeletal:     Cervical back: Normal range of motion.  Neurological:     Mental Status: He is alert and oriented to person, place, and time.  Psychiatric:        Mood and Affect: Mood normal.        Behavior: Behavior normal.        Thought Content: Thought content normal.        Judgment: Judgment normal.    Results for orders placed or performed during the hospital encounter of 08/16/20 (from the past 24 hour(s))  POC Urinalysis dipstick     Status: None   Collection Time: 08/16/20 10:05 AM  Result Value Ref Range   Glucose, UA NEGATIVE NEGATIVE mg/dL   Bilirubin Urine NEGATIVE NEGATIVE   Ketones, ur NEGATIVE NEGATIVE mg/dL   Specific Gravity, Urine 1.025 1.005 - 1.030   Hgb  urine dipstick NEGATIVE NEGATIVE   pH 5.0 5.0 - 8.0   Protein, ur NEGATIVE NEGATIVE mg/dL   Urobilinogen, UA 1.0 0.0 - 1.0 mg/dL   Nitrite NEGATIVE NEGATIVE   Leukocytes,Ua NEGATIVE NEGATIVE    Assessment and Plan :   PDMP not reviewed this encounter.  1. Difficulty urinating   2. History of renal stone     Counseled on possibility of renal colic versus symptomatic BPH.  As he has no tenderness hematuria, will manage for either with Flomax, aggressive hydration and follow-up with urology. Counseled patient on potential for adverse effects with medications prescribed/recommended today, ER and return-to-clinic precautions discussed, patient verbalized understanding.    Wallis Bamberg, PA-C 08/16/20 [phone]

## 2020-08-16 NOTE — Discharge Instructions (Signed)
Please make an appointment for follow-up with urologist as I suspect that when discharges appear difficulty urinating is the prostate.  Over time, the prostate can become enlarged and make it more difficult for you to urinate but she wanted make sure that you get this evaluated through imaging and lab work.  In the meantime, you can start a medication called tamsulosin to help you urinate better.

## 2020-08-16 NOTE — ED Triage Notes (Signed)
Pt presents with generalized abdominal pain, nausea, and difficulty urinating over the past few days.  Pt has Hx of kidney stones

## 2020-09-05 ENCOUNTER — Emergency Department (HOSPITAL_BASED_OUTPATIENT_CLINIC_OR_DEPARTMENT_OTHER)
Admission: EM | Admit: 2020-09-05 | Discharge: 2020-09-06 | Disposition: A | Discharge status: Home / Self Care | Payer: BLUE CROSS/BLUE SHIELD | Attending: Emergency Medicine | Admitting: Emergency Medicine

## 2020-09-05 ENCOUNTER — Other Ambulatory Visit: Payer: Self-pay

## 2020-09-05 ENCOUNTER — Encounter (HOSPITAL_BASED_OUTPATIENT_CLINIC_OR_DEPARTMENT_OTHER): Payer: Self-pay

## 2020-09-05 DIAGNOSIS — R1013 Epigastric pain: Secondary | ICD-10-CM | POA: Insufficient documentation

## 2020-09-05 DIAGNOSIS — E860 Dehydration: Secondary | ICD-10-CM | POA: Insufficient documentation

## 2020-09-05 DIAGNOSIS — R109 Unspecified abdominal pain: Secondary | ICD-10-CM | POA: Diagnosis present

## 2020-09-05 NOTE — ED Triage Notes (Signed)
Pt states he has been  Dehydrated x 2-3 days -  Mouth is dry and thirsty  All time - denies vomiting and diarrhea    pt came in ambulatory - alert and oreinted

## 2020-09-06 ENCOUNTER — Emergency Department (HOSPITAL_BASED_OUTPATIENT_CLINIC_OR_DEPARTMENT_OTHER): Payer: BLUE CROSS/BLUE SHIELD | Admitting: Radiology

## 2020-09-06 ENCOUNTER — Emergency Department (HOSPITAL_BASED_OUTPATIENT_CLINIC_OR_DEPARTMENT_OTHER): Payer: BLUE CROSS/BLUE SHIELD

## 2020-09-06 LAB — COMPREHENSIVE METABOLIC PANEL
ALT: 28 U/L (ref 0–44)
AST: 28 U/L (ref 15–41)
Albumin: 4 g/dL (ref 3.5–5.0)
Alkaline Phosphatase: 65 U/L (ref 38–126)
Anion gap: 7 (ref 5–15)
BUN: 13 mg/dL (ref 6–20)
CO2: 26 mmol/L (ref 22–32)
Calcium: 9.5 mg/dL (ref 8.9–10.3)
Chloride: 103 mmol/L (ref 98–111)
Creatinine, Ser: 1.04 mg/dL (ref 0.61–1.24)
GFR, Estimated: >60 mL/min (ref >60)
Glucose, Bld: 97 mg/dL (ref 70–99)
Potassium: 3.9 mmol/L (ref 3.5–5.1)
Sodium: 136 mmol/L (ref 135–145)
Total Bilirubin: 1 mg/dL (ref 0.3–1.2)
Total Protein: 7.4 g/dL (ref 6.5–8.1)

## 2020-09-06 LAB — URINALYSIS, ROUTINE W REFLEX MICROSCOPIC
Bilirubin Urine: NEGATIVE
Glucose, UA: NEGATIVE mg/dL
Hgb urine dipstick: NEGATIVE
Ketones, ur: NEGATIVE mg/dL
Leukocytes,Ua: NEGATIVE
Nitrite: NEGATIVE
Protein, ur: NEGATIVE mg/dL
Specific Gravity, Urine: 1.026 (ref 1.005–1.030)
pH: 6 (ref 5.0–8.0)

## 2020-09-06 LAB — CBC
HCT: 40.4 % (ref 39.0–52.0)
Hemoglobin: 13.4 g/dL (ref 13.0–17.0)
MCH: 30.1 pg (ref 26.0–34.0)
MCHC: 33.2 g/dL (ref 30.0–36.0)
MCV: 90.8 fL (ref 80.0–100.0)
Platelets: 281 10*3/uL (ref 150–400)
RBC: 4.45 MIL/uL (ref 4.22–5.81)
RDW: 12.6 % (ref 11.5–15.5)
WBC: 9.4 10*3/uL (ref 4.0–10.5)
nRBC: 0 % (ref 0.0–0.2)

## 2020-09-06 LAB — TROPONIN I (HIGH SENSITIVITY): Troponin I (High Sensitivity): 6 ng/L (ref <18)

## 2020-09-06 LAB — LIPASE, BLOOD: Lipase: 22 U/L (ref 11–51)

## 2020-09-06 LAB — CBG MONITORING, ED: Glucose-Capillary: 88 mg/dL (ref 70–99)

## 2020-09-06 MED ORDER — OMEPRAZOLE 20 MG PO CPDR: 20.0000 mg | DELAYED_RELEASE_CAPSULE | Freq: Every day | ORAL | 1 refills | Status: AC

## 2020-09-06 MED ORDER — SUCRALFATE 1 G PO TABS: 1.0000 g | ORAL_TABLET | Freq: Four times a day (QID) | ORAL | 0 refills | Status: AC | PRN

## 2020-09-06 NOTE — ED Provider Notes (Signed)
[facility] Emergency Department Provider Note MRN:  [id number]  Arrival date & time: 09/06/20     Chief Complaint   Dehydration   History of Present Illness   Bruce Guerra is a 55 y.o. year-old male with a history of kidney stone presenting to the ED with chief complaint of dehydration.  Patient feels dehydrated but does not know why he would be dehydrated.  He explains that he has been drinking a lot of water but continues to have dry mouth.  Despite drinking a lot of water he is not having a lot of urination.  Has also been having some diffuse abdominal discomfort for about 3 weeks, currently minimally uncomfortable.  Occasionally has some pain going to his chest, last occurring earlier today.  Denies shortness of breath, no leg pain or swelling, no recent fever, no cough or cold-like symptoms.  Review of Systems  A complete 10 system review of systems was obtained and all systems are negative except as noted in the HPI and PMH.   Patient's Health History    Past Medical History:  Diagnosis Date  . Allergy   . SVT (supraventricular tachycardia) (HCC)     History reviewed. No pertinent surgical history.  Family History  Problem Relation Age of Onset  . Diabetes Mother   . Hypertension Father     Social History   Socioeconomic History  . Marital status: Married    Spouse name: Not on file  . Number of children: Not on file  . Years of education: Not on file  . Highest education level: Not on file  Occupational History  . Not on file  Tobacco Use  . Smoking status: Never Smoker  . Smokeless tobacco: Never Used  Substance and Sexual Activity  . Alcohol use: No  . Drug use: No  . Sexual activity: Yes    Birth control/protection: None  Other Topics Concern  . Not on file  Social History Narrative  . Not on file   Social Determinants of Health   Financial Resource Strain: Not on file  Food Insecurity: Not on file  Transportation Needs:  Not on file  Physical Activity: Not on file  Stress: Not on file  Social Connections: Not on file  Intimate Partner Violence: Not on file     Physical Exam   Vitals:   09/05/20 2317 09/06/20 0045  BP:  (!) 147/99  Pulse: 72 63  Resp:  12  Temp: 98.3 F (36.8 C)   SpO2: 100% 98%    CONSTITUTIONAL: Well-appearing, NAD NEURO:  Alert and oriented x 3, no focal deficits EYES:  eyes equal and reactive ENT/NECK:  no LAD, no JVD CARDIO: Regular rate, well-perfused, normal S1 and S2 PULM:  CTAB no wheezing or rhonchi GI/GU:  normal bowel sounds, non-distended, non-tender MSK/SPINE:  No gross deformities, no edema SKIN:  no rash, atraumatic PSYCH:  Appropriate speech and behavior  *Additional and/or pertinent findings included in MDM below  Diagnostic and Interventional Summary    EKG Interpretation  Date/Time:  Thursday September 06 2020 00:39:41 EDT Ventricular Rate:  66 PR Interval:  174 QRS Duration: 102 QT Interval:  393 QTC Calculation: 412 R Axis:   77 Text Interpretation: Sinus rhythm RSR' in V1 or V2, right VCD or RVH Borderline T abnormalities, diffuse leads No significant change was found Confirmed by Kennis Carina [phone]) on 09/06/2020 1:00:52 AM      Labs Reviewed  CBC  COMPREHENSIVE METABOLIC PANEL  LIPASE,  BLOOD  URINALYSIS, ROUTINE W REFLEX MICROSCOPIC  CBG MONITORING, ED  TROPONIN I (HIGH SENSITIVITY)    CT RENAL STONE STUDY  Final Result    DG ABD ACUTE 2+V W 1V CHEST  Final Result      Medications - No data to display   Procedures  /  Critical Care Procedures  ED Course and Medical Decision Making  I have reviewed the triage vital signs, the nursing notes, and pertinent available records from the EMR.  Listed above are laboratory and imaging tests that I personally ordered, reviewed, and interpreted and then considered in my medical decision making (see below for details).  No vomiting or diarrhea, no recent new activities or sweating,  drinking a lot of water, and so unclear reason why patient would be dehydrated.  We will check screening labs.  Given the chest pain will also evaluate with EKG and troponin to rule out ACS but this is felt to be unlikely.  Given the dry mouth, diabetes also considered.  Metabolic disarray also a possibility, will check electrolytes.  Overall benign abdomen, but patient explains he has had a similar presentation a few years ago that was due to a kidney stone.  Will check urine, screening plain film.     Work-up overall reassuring.  CT renal study obtained given the difficulty voiding to exclude obstructive process.  Patient explains that his abdominal pain is similar to prior kidney stone.  CT is normal.  Appropriate for discharge.  Elmer Sow. Pilar Plate, MD [facility] Emergency Medicine [facility] [email or web address]  Final Clinical Impressions(s) / ED Diagnoses     ICD-10-CM   1. Epigastric pain  R10.13 DG ABD ACUTE 2+V W 1V CHEST    DG ABD ACUTE 2+V W 1V CHEST    ED Discharge Orders         Ordered    omeprazole (PRILOSEC) 20 MG capsule  Daily        09/06/20 0202    sucralfate (CARAFATE) 1 g tablet  4 times daily PRN        09/06/20 0202           Discharge Instructions Discussed with and Provided to Patient:     Discharge Instructions     You were evaluated in the Emergency Department and after careful evaluation, we did not find any emergent condition requiring admission or further testing in the hospital.  Your exam/testing today was overall reassuring.  Your symptoms may be due to acid reflux.  Use the omeprazole medication daily to prevent discomfort.  You can use the Carafate medication as needed for active pain throughout the day.  Please return to the Emergency Department if you experience any worsening of your condition.  Thank you for allowing [place] to be a part of your care.        Sabas Sous, MD 09/06/20 [phone]

## 2020-09-06 NOTE — Discharge Instructions (Addendum)
You were evaluated in the Emergency Department and after careful evaluation, we did not find any emergent condition requiring admission or further testing in the hospital.  Your exam/testing today was overall reassuring.  Your symptoms may be due to acid reflux.  Use the omeprazole medication daily to prevent discomfort.  You can use the Carafate medication as needed for active pain throughout the day.  Please return to the Emergency Department if you experience any worsening of your condition.  Thank you for allowing [place] to be a part of your care.

## 2020-12-31 ENCOUNTER — Other Ambulatory Visit: Payer: Self-pay

## 2020-12-31 ENCOUNTER — Encounter (HOSPITAL_BASED_OUTPATIENT_CLINIC_OR_DEPARTMENT_OTHER): Payer: Self-pay

## 2020-12-31 DIAGNOSIS — N289 Disorder of kidney and ureter, unspecified: Secondary | ICD-10-CM | POA: Diagnosis not present

## 2020-12-31 DIAGNOSIS — R55 Syncope and collapse: Secondary | ICD-10-CM | POA: Insufficient documentation

## 2020-12-31 NOTE — ED Triage Notes (Signed)
Reports today noticed when he would exert himself he would start feeling bad and weak.  Reports he missed a step and had a syncopal episode that lasted a few seconds.  Stated "tried to open eyes and all I saw was black".

## 2021-01-01 ENCOUNTER — Emergency Department (HOSPITAL_BASED_OUTPATIENT_CLINIC_OR_DEPARTMENT_OTHER)
Admission: EM | Admit: 2021-01-01 | Discharge: 2021-01-01 | Disposition: A | Discharge status: Home / Self Care | Payer: BLUE CROSS/BLUE SHIELD | Attending: Emergency Medicine | Admitting: Emergency Medicine

## 2021-01-01 DIAGNOSIS — R55 Syncope and collapse: Secondary | ICD-10-CM

## 2021-01-01 DIAGNOSIS — N289 Disorder of kidney and ureter, unspecified: Secondary | ICD-10-CM

## 2021-01-01 LAB — BASIC METABOLIC PANEL
Anion gap: 9 (ref 5–15)
BUN: 16 mg/dL (ref 6–20)
CO2: 26 mmol/L (ref 22–32)
Calcium: 9.6 mg/dL (ref 8.9–10.3)
Chloride: 102 mmol/L (ref 98–111)
Creatinine, Ser: 1.33 mg/dL — ABNORMAL HIGH (ref 0.61–1.24)
GFR, Estimated: >60 mL/min (ref >60)
Glucose, Bld: 84 mg/dL (ref 70–99)
Potassium: 3.8 mmol/L (ref 3.5–5.1)
Sodium: 137 mmol/L (ref 135–145)

## 2021-01-01 LAB — URINALYSIS, ROUTINE W REFLEX MICROSCOPIC
Bilirubin Urine: NEGATIVE
Glucose, UA: NEGATIVE mg/dL
Hgb urine dipstick: NEGATIVE
Ketones, ur: NEGATIVE mg/dL
Leukocytes,Ua: NEGATIVE
Nitrite: NEGATIVE
Specific Gravity, Urine: 1.028 (ref 1.005–1.030)
pH: 5.5 (ref 5.0–8.0)

## 2021-01-01 LAB — CBC
HCT: 37.9 % — ABNORMAL LOW (ref 39.0–52.0)
Hemoglobin: 12.6 g/dL — ABNORMAL LOW (ref 13.0–17.0)
MCH: 30.4 pg (ref 26.0–34.0)
MCHC: 33.2 g/dL (ref 30.0–36.0)
MCV: 91.3 fL (ref 80.0–100.0)
Platelets: 253 10*3/uL (ref 150–400)
RBC: 4.15 MIL/uL — ABNORMAL LOW (ref 4.22–5.81)
RDW: 12.8 % (ref 11.5–15.5)
WBC: 10.2 10*3/uL (ref 4.0–10.5)
nRBC: 0 % (ref 0.0–0.2)

## 2021-01-01 LAB — TROPONIN I (HIGH SENSITIVITY): Troponin I (High Sensitivity): 13 ng/L (ref <18)

## 2021-01-01 LAB — CK: Total CK: 515 U/L — ABNORMAL HIGH (ref 49–397)

## 2021-01-01 MED ORDER — SODIUM CHLORIDE 0.9 % IV BOLUS
1000.0000 mL | Freq: Once | INTRAVENOUS | Status: AC
  Administered 2021-01-01: 1000 mL via INTRAVENOUS

## 2021-01-01 NOTE — ED Provider Notes (Signed)
[facility] EMERGENCY DEPT Provider Note   CSN: [id number] Arrival date & time: 12/31/20  2246     History Chief Complaint  Patient presents with   Loss of Consciousness    Bruce Guerra is a 55 y.o. male.  The history is provided by the patient.  Loss of Consciousness Episode history:  Single Most recent episode:  Today Timing:  Constant Progression:  Improving Chronicity:  New Context: exertion   Relieved by:  Certain positions Worsened by:  Nothing Associated symptoms: diaphoresis   Associated symptoms: no chest pain, no difficulty breathing, no fever, no focal weakness, no headaches, no palpitations, no shortness of breath and no vomiting   Risk factors: no coronary artery disease   Patient presents for syncopal episode. Patient reports he woke up in the morning at his baseline. He reports he ate breakfast and went to work.  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  He drove to [place] today for his work He reports while he was unloading the trucks he began having mild diaphoresis and generalized weakness.  He denies any active chest pain or shortness of breath.  No back pain. He reports he had to take breaks frequently due to fatigue. He reports that when it is hot outside he typically will have to take breaks frequently but this seemed worse. He reports while he was working he missed a step which caused him to fall.  He did not hit his head.  When he tried to get up he reports everything "went black "and lost consciousness for just a few seconds.  Since that time he has had generalized weakness, but no chest pain no palpitations He denies any recent syncopal episodes He only takes allergy medications.  No other new meds.   Patient does report a history of " gas" and abdominal distention that will cause pain intermittently for quite some time.  He did have an episode of that today. 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 Authorizing Provider  cetirizine (ZYRTEC) 10 MG tablet Take 10 mg by mouth daily.    [provider]  fexofenadine (ALLEGRA) 180 MG tablet Take 180 mg by mouth daily.    [provider]  methotrexate (RHEUMATREX) 10 MG tablet Take 10 mg by mouth once a week. Reported on 09/24/2015    [provider]  omeprazole (PRILOSEC) 20 MG capsule Take 1 capsule (20 mg total) by mouth daily. 09/06/20   Sabas Sous, MD  sucralfate (CARAFATE) 1 g tablet Take 1 tablet (1 g total) by mouth 4 (four) times daily as needed. 09/06/20   Sabas Sous, MD    Allergies    Patient has no known allergies.  Review of Systems  Review of Systems  Constitutional:  Positive for diaphoresis and fatigue. Negative for fever.  Respiratory:  Negative for shortness of breath.   Cardiovascular:  Positive for syncope. Negative for chest pain and palpitations.  Gastrointestinal:  Positive for abdominal distention. Negative for vomiting.  Neurological:  Positive for syncope. Negative for focal weakness and headaches.  All other systems reviewed and are negative.  Physical Exam Updated Vital Signs BP (!) 156/97   Pulse 78   Temp 98.1 F (36.7 C)   Resp 19   Ht 1.803 m (5\' 11" )   Wt 110.2 kg   SpO2 100%   BMI 33.89 kg/m   Physical Exam CONSTITUTIONAL:  Well developed/well nourished HEAD: Normocephalic/atraumatic EYES: EOMI/PERRL ENMT: Mucous membranes moist NECK: supple no meningeal signs SPINE/BACK:entire spine nontender CV: S1/S2 noted, no murmurs/rubs/gallops noted, no loud harsh murmurs LUNGS: Lungs are clear to auscultation bilaterally, no apparent distress ABDOMEN: soft, nontender, no rebound or guarding, bowel sounds noted throughout abdomen GU:no cva tenderness NEURO: Pt is awake/alert/appropriate, moves all extremitiesx4.  No facial droop.  No arm or leg drift EXTREMITIES: pulses normal/equal, full ROM, no calf tenderness or edema. SKIN: warm, color normal PSYCH: no abnormalities of mood noted, alert and oriented to situation  ED Results / Procedures / Treatments   Labs (all labs ordered are listed, but only abnormal results are displayed) Labs Reviewed  BASIC METABOLIC PANEL - Abnormal; Notable for the following components:      Result Value   Creatinine, Ser 1.33 (*)    All other components within normal limits  CBC - Abnormal; Notable for the following components:   RBC 4.15 (*)    Hemoglobin 12.6 (*)    HCT 37.9 (*)    All other components within normal limits  URINALYSIS, ROUTINE W REFLEX MICROSCOPIC - Abnormal; Notable for the following components:   Protein, ur TRACE (*)    All other components within normal limits  CK - Abnormal; Notable for the following components:   Total CK 515 (*)    All other components within normal limits  TROPONIN I (HIGH SENSITIVITY)    EKG EKG Interpretation  Date/Time:  Tuesday January 01 2021 01:51:27 EDT Ventricular Rate:  73 PR Interval:  171 QRS Duration: 145 QT Interval:  536 QTC Calculation: 591 R Axis:   55 Text Interpretation: Sinus rhythm Right bundle branch block Confirmed by 06-08-2003 (Zadie Rhine) on 01/01/2021 2:20:33 AM  Radiology No results found.  Procedures Procedures   Medications Ordered in ED Medications  sodium chloride 0.9 % bolus 1,000 mL (0  mLs Intravenous Stopped 01/01/21 0243)    ED Course  I have reviewed the triage vital signs and the nursing notes.  Pertinent labs & imaging results that were available during my care of the patient were reviewed by me and considered in my medical decision making (see chart for details).    MDM Rules/Calculators/A&P                           Patient describes syncopal episode after working outside unloading cars.  This occurred after he had a mechanical fall and reports everything went black upon try to get up.  He denies any chest pain or shortness of breath.  On review of his chart, he had episode of SVT in 2017 and had a cardiology follow-up at that time.  Echocardiogram at that time revealed ejection fraction of 45-50%.  Likely diastolic dysfunction.  Normal aortic valve.  Cardiac MRI at that time revealed mild LV enlargement but a normal EF Patient no longer takes any antihypertensives.  He denies any recent palpitations.  Labs revealed mild renal insufficiency but no other acute findings. EKG does reveal right bundle branch block which is new compared to EKGs from several months ago Patient is very well-appearing at this time, no acute distress and feels improved This is likely related to working outside and potentially became overheated, but I do feel cardiology referral is important given his age, previous history as well as a new right bundle branch block on EKG 2:49 AM Patient feels improved.  He feels comfortable for discharge home.  He will take several days off of work as I do not want him driving long distances until seen by cardiology.  Cardiology referral has been placed.  However he will be seen in the [facility] system is that is  where his primary doctor is located We discussed return precautions Final Clinical Impression(s) / ED Diagnoses Final diagnoses:  Syncope and collapse  Renal insufficiency    Rx / DC Orders ED Discharge Orders          Ordered     Ambulatory referral to Cardiology        01/01/21 0134             Zadie Rhine, MD 01/01/21 [phone]
